# Patient Record
Sex: Female | Born: 1970 | Race: White | Hispanic: No | Smoking: Current some day smoker
Health system: Southern US, Community
[De-identification: ages and names within clinical notes are randomized; demographics above are authoritative.]

## PROBLEM LIST (undated history)

## (undated) DIAGNOSIS — F988 Other specified behavioral and emotional disorders with onset usually occurring in childhood and adolescence: Secondary | ICD-10-CM

## (undated) DIAGNOSIS — K219 Gastro-esophageal reflux disease without esophagitis: Secondary | ICD-10-CM

## (undated) DIAGNOSIS — I10 Essential (primary) hypertension: Secondary | ICD-10-CM

## (undated) HISTORY — PX: CARPAL TUNNEL RELEASE: SHX101

---

## 2006-04-05 ENCOUNTER — Emergency Department: Payer: Self-pay | Admitting: Emergency Medicine

## 2006-05-09 ENCOUNTER — Ambulatory Visit: Payer: Self-pay | Admitting: Internal Medicine

## 2009-01-07 ENCOUNTER — Emergency Department: Payer: Self-pay | Admitting: Emergency Medicine

## 2010-06-15 ENCOUNTER — Emergency Department: Payer: Self-pay | Admitting: Emergency Medicine

## 2010-08-21 ENCOUNTER — Ambulatory Visit: Payer: Self-pay | Admitting: Orthopedic Surgery

## 2011-12-11 IMAGING — CR DG HAND COMPLETE 3+V*L*
1 series · 3 of 3 positions shown · non-contrast
Comparison: none

REASON FOR EXAM: s/p accident
COMMENTS:

[Series 1: view not recorded · 0.17mm/px · 3 of 3 slices shown]
[im 1/3]
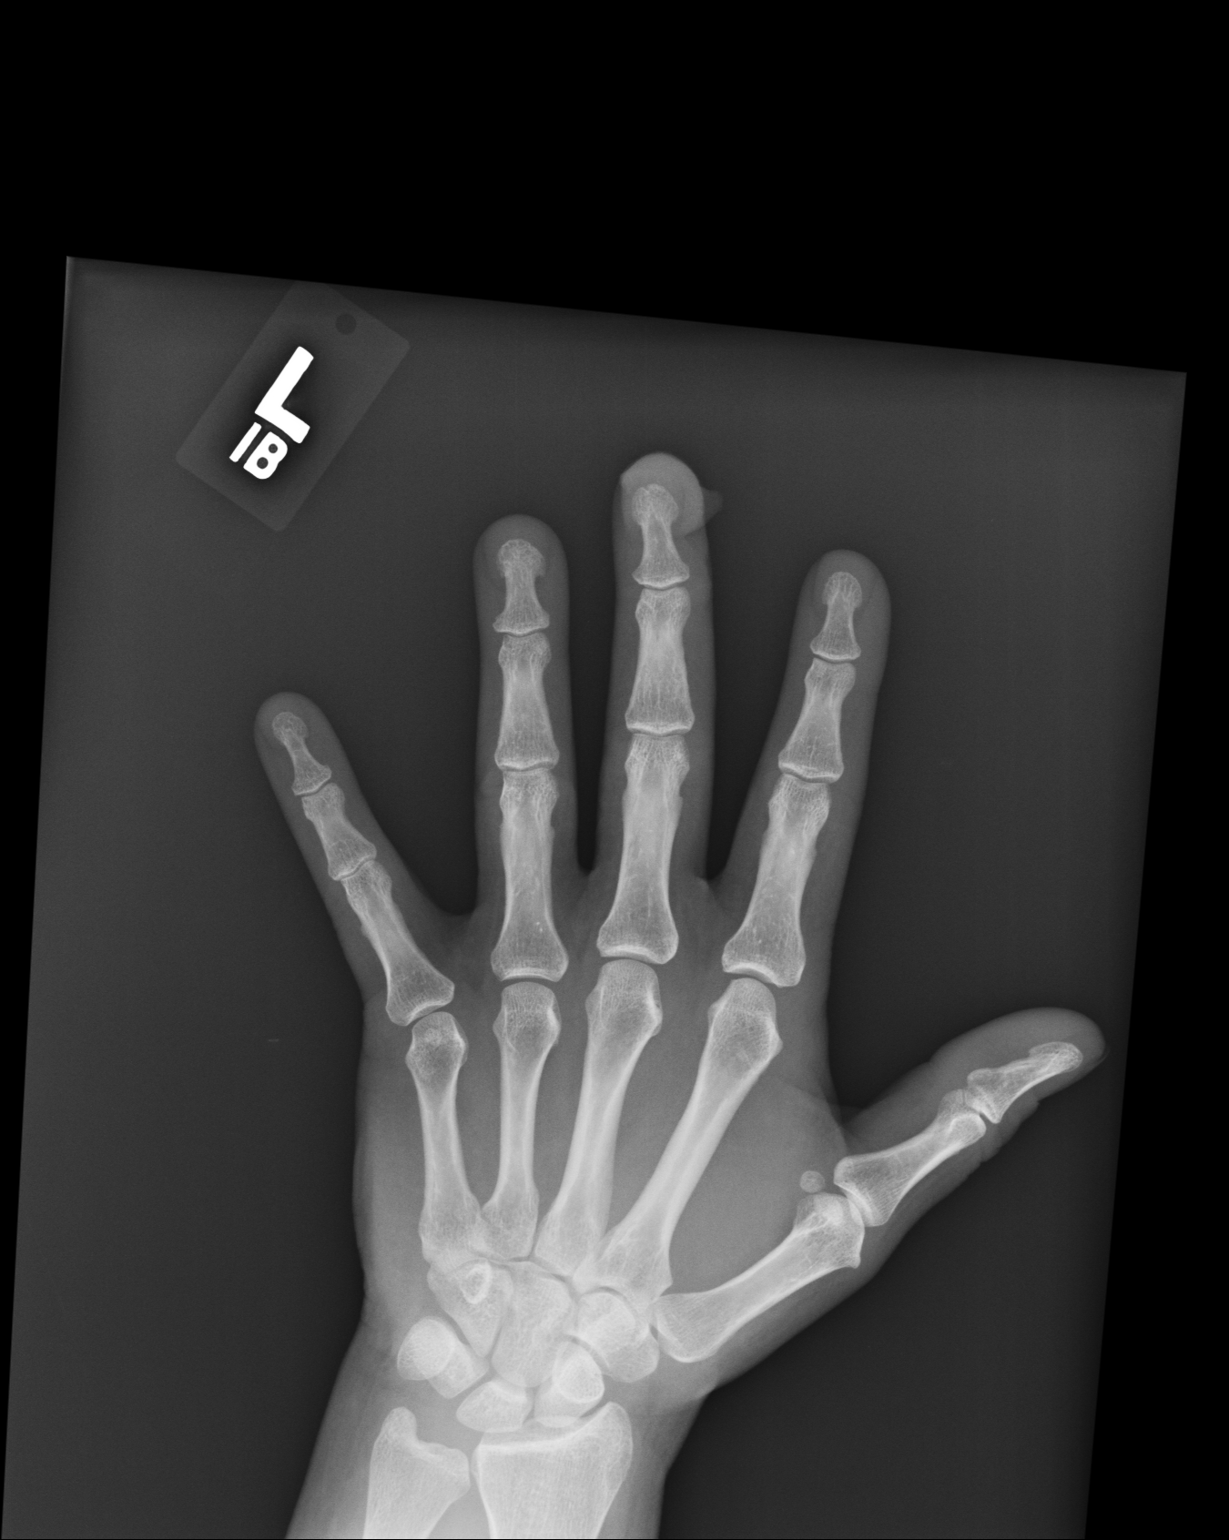
[im 2/3]
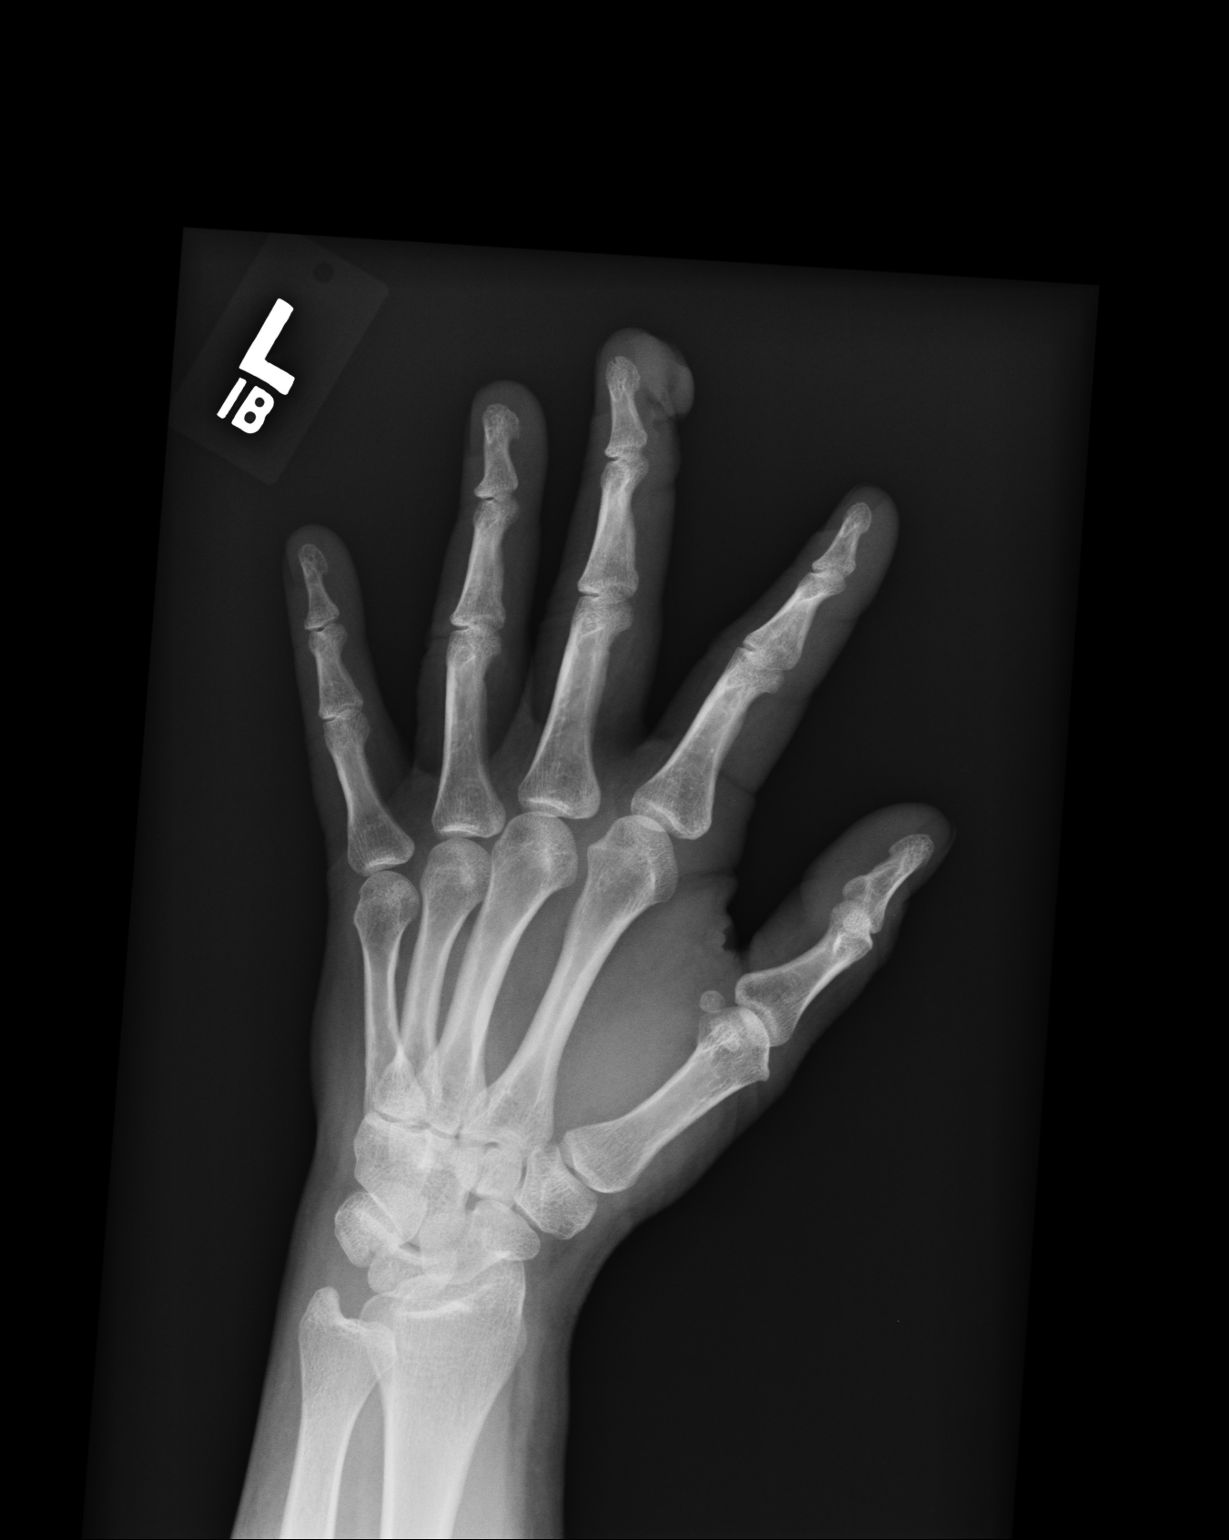
[im 3/3]
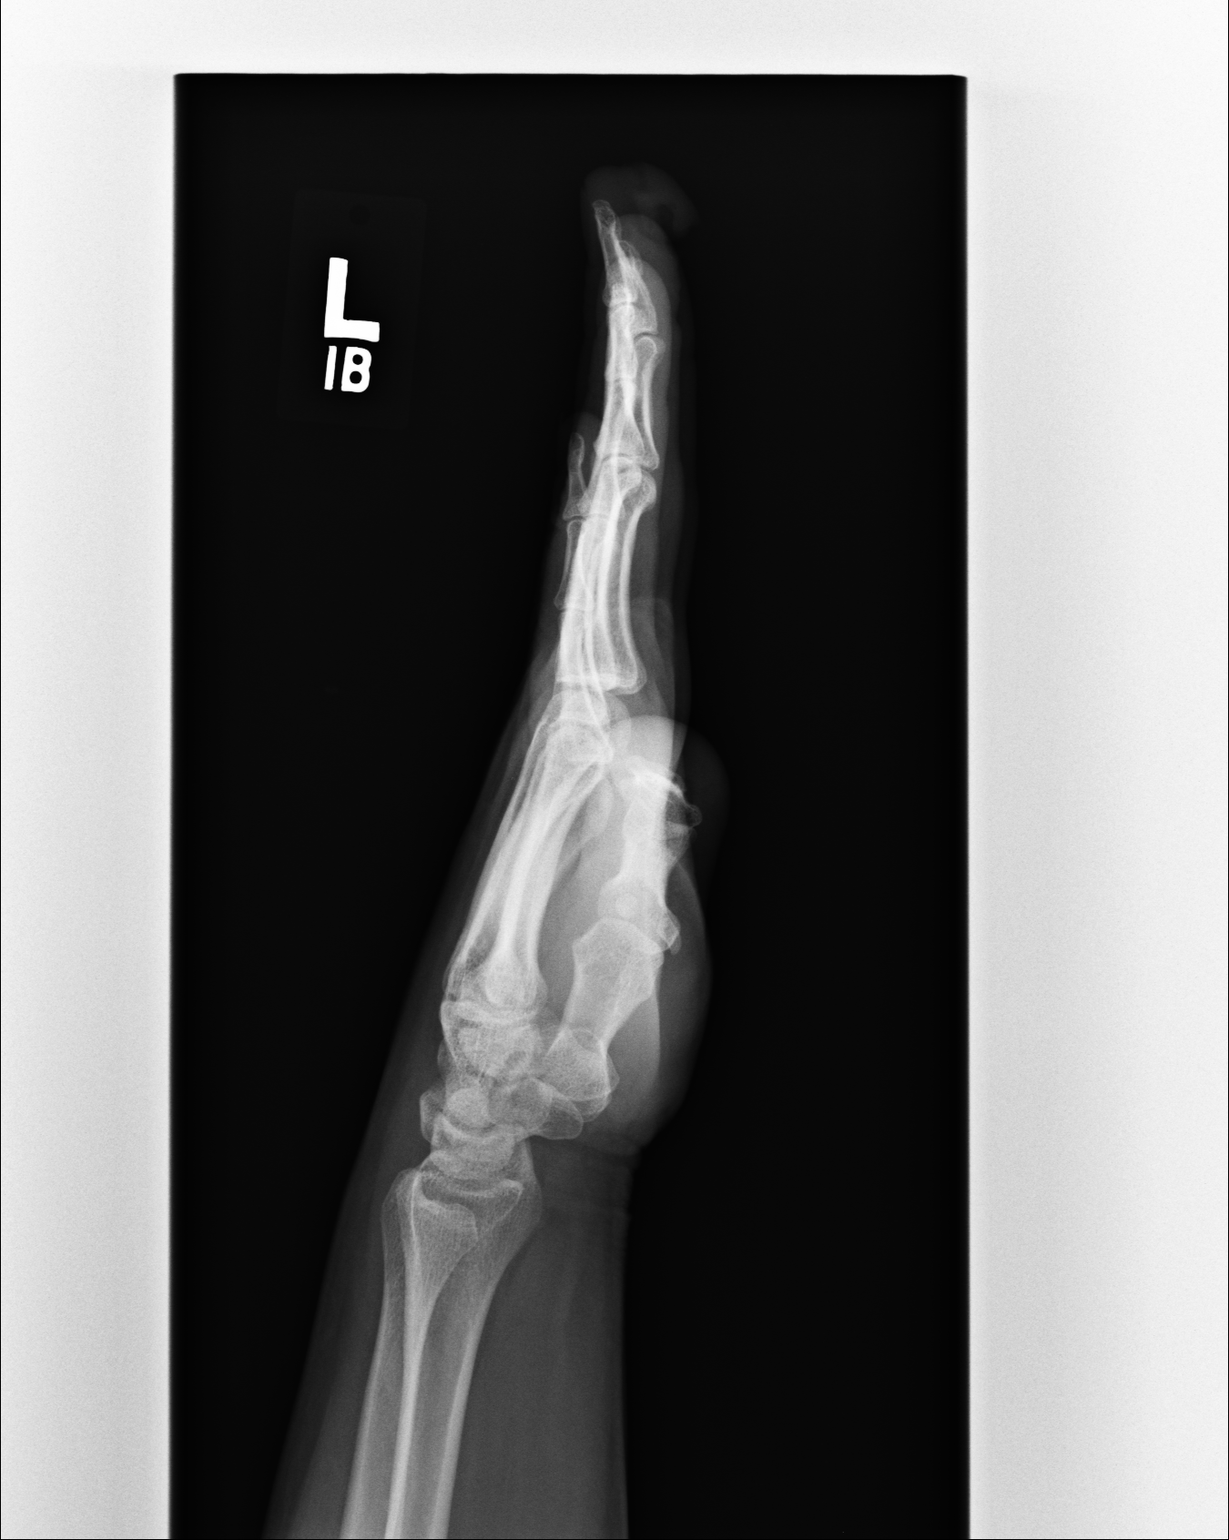

[3 of 3 positions shown; findings below may reference images not displayed]

PROCEDURE:     DXR - DXR HAND LT COMPLETE  W/OBLIQUES  - June 15, 2010  [DATE]

RESULT:     No fracture, dislocation or other acute bony abnormality is
identified. There is observed deformity of the soft tissues and gas within
the soft tissues about the volar aspect of the distal portion of the middle
finger. No radiodense soft tissue foreign body is seen.
IMPRESSION: 1.  No fracture is identified.
2.  There are changes consistent with soft tissue injury volar to the distal
phalanx of the left, middle finger.

## 2012-04-07 ENCOUNTER — Emergency Department (HOSPITAL_COMMUNITY)
Admission: EM | Admit: 2012-04-07 | Discharge: 2012-04-07 | Disposition: A | Discharge status: Home / Self Care | Payer: Medicaid Other | Attending: Emergency Medicine | Admitting: Emergency Medicine

## 2012-04-07 ENCOUNTER — Encounter (HOSPITAL_COMMUNITY): Payer: Self-pay

## 2012-04-07 ENCOUNTER — Emergency Department (HOSPITAL_COMMUNITY): Payer: Medicaid Other

## 2012-04-07 DIAGNOSIS — N134 Hydroureter: Secondary | ICD-10-CM | POA: Insufficient documentation

## 2012-04-07 DIAGNOSIS — F17200 Nicotine dependence, unspecified, uncomplicated: Secondary | ICD-10-CM | POA: Insufficient documentation

## 2012-04-07 DIAGNOSIS — I10 Essential (primary) hypertension: Secondary | ICD-10-CM | POA: Insufficient documentation

## 2012-04-07 DIAGNOSIS — N201 Calculus of ureter: Secondary | ICD-10-CM | POA: Insufficient documentation

## 2012-04-07 DIAGNOSIS — N132 Hydronephrosis with renal and ureteral calculous obstruction: Secondary | ICD-10-CM

## 2012-04-07 HISTORY — DX: Essential (primary) hypertension: I10

## 2012-04-07 LAB — BASIC METABOLIC PANEL
Calcium: 9.6 mg/dL (ref 8.4–10.5)
Creatinine, Ser: 0.85 mg/dL (ref 0.50–1.10)
GFR calc Af Amer: >90 mL/min (ref >90)
GFR calc non Af Amer: 85 mL/min — ABNORMAL LOW (ref >90)
Sodium: 138 mEq/L (ref 135–145)

## 2012-04-07 LAB — CBC
MCH: 28.3 pg (ref 26.0–34.0)
MCV: 82.3 fL (ref 78.0–100.0)
Platelets: 272 10*3/uL (ref 150–400)
RDW: 13.8 % (ref 11.5–15.5)

## 2012-04-07 LAB — URINALYSIS, ROUTINE W REFLEX MICROSCOPIC
Nitrite: NEGATIVE
Protein, ur: 30 mg/dL — AB
Urobilinogen, UA: 0.2 mg/dL (ref 0.0–1.0)

## 2012-04-07 LAB — PREGNANCY, URINE: Preg Test, Ur: NEGATIVE

## 2012-04-07 MED ORDER — KETOROLAC TROMETHAMINE 30 MG/ML IJ SOLN
30.0000 mg | Freq: Once | INTRAMUSCULAR | Status: AC
  Administered 2012-04-07: 30 mg via INTRAVENOUS
  Filled 2012-04-07: qty 1

## 2012-04-07 MED ORDER — HYDROMORPHONE HCL PF 1 MG/ML IJ SOLN
1.0000 mg | Freq: Once | INTRAMUSCULAR | Status: AC
  Administered 2012-04-07: 1 mg via INTRAVENOUS
  Filled 2012-04-07: qty 1

## 2012-04-07 MED ORDER — OXYCODONE-ACETAMINOPHEN 5-325 MG PO TABS: 1.0000 | ORAL_TABLET | Freq: Four times a day (QID) | ORAL | Status: AC | PRN

## 2012-04-07 MED ORDER — ONDANSETRON HCL 4 MG/2ML IJ SOLN
4.0000 mg | Freq: Once | INTRAMUSCULAR | Status: AC
  Administered 2012-04-07: 4 mg via INTRAVENOUS
  Filled 2012-04-07: qty 2

## 2012-04-07 MED ORDER — SODIUM CHLORIDE 0.9 % IV SOLN
Freq: Once | INTRAVENOUS | Status: AC
  Administered 2012-04-07: 07:00:00 via INTRAVENOUS

## 2012-04-07 NOTE — ED Notes (Signed)
Pt up to bathroom, after returning, pt reports worsening pain, rating pain 8/10.  edp notified and orders received.

## 2012-04-07 NOTE — ED Notes (Signed)
Having left lower abdominal pain that started around 4 am this morning per pt. Vomited once this morning after taking one Vicodin. No diarrhea per pt.

## 2012-04-07 NOTE — ED Notes (Signed)
MD at bedside. 

## 2012-04-07 NOTE — ED Provider Notes (Signed)
History  This chart was scribed for [employer]. Rubin Payor, MD by Bennett Scrape. This patient was seen in room [facility] and the patient's care was started at 7:03AM.  CSN: [id number]  Arrival date & time 04/07/12  4098   First MD Initiated Contact with Patient 04/07/12 0703      Chief Complaint  Patient presents with  . Abdominal Pain    The history is provided by the patient. No language interpreter was used.    Tracie Bailey is a 41 y.o. female who presents to the Emergency Department complaining of 3 hours of gradual onset, gradually worsening, constant LLQ abdominal pain that radiates into the suprapubic area with associated nausea, emesis, urgency and frequency. She reports one episode of emesis this morning after takine one Vicodin to improve her symptoms. She denies having any modifying factors. She denies having prior episodes of similar symptoms and denies having a h/o kidney stones. She denies fever, rash, diarrhea, vaginal bleeding and vaginal discharge as associated symptoms and denies that possibility of pregnancy stating that she had a recent Mirena implant change. She has a h/o HTN. She is a current some day smoker and occasional alcohol user.   Past Medical History  Diagnosis Date  . Hypertension     Past Surgical History  Procedure Date  . Carpal tunnel release     No family history on file.  History  Substance Use Topics  . Smoking status: Current Some Day Smoker -- 1.0 packs/day    Types: Cigarettes  . Smokeless tobacco: Not on file  . Alcohol Use: Yes     occasional     NO OB history provided.  Review of Systems  Constitutional: Positive for appetite change. Negative for fever.  Gastrointestinal: Positive for nausea, vomiting and abdominal pain. Negative for diarrhea.  Genitourinary: Positive for urgency and frequency. Negative for dysuria, vaginal bleeding and vaginal discharge.  Musculoskeletal: Negative for back pain.  Skin: Negative for rash.      Allergies  Review of patient's allergies indicates no known allergies.  Home Medications   Current Outpatient Rx  Name Route Sig Dispense Refill  . AMPHETAMINE-DEXTROAMPHET ER 30 MG PO CP24 Oral Take 30 mg by mouth every morning.    Marland Kitchen BENAZEPRIL-HYDROCHLOROTHIAZIDE 20-12.5 MG PO TABS Oral Take 1 tablet by mouth daily.    . IBUPROFEN 200 MG PO TABS Oral Take 400 mg by mouth every 6 (six) hours as needed. For pain    . OMEPRAZOLE 10 MG PO CPDR Oral Take 10 mg by mouth daily.    . OXYCODONE-ACETAMINOPHEN 5-325 MG PO TABS Oral Take 1-2 tablets by mouth every 6 (six) hours as needed for pain. 20 tablet 0    Triage Vitals: BP 148/101  Pulse 78  Temp 98.3 F (36.8 C) (Oral)  Resp 20  Ht 5\' 9"  (1.753 m)  Wt 252 lb (114.306 kg)  BMI 37.21 kg/m2  SpO2 100%  Physical Exam  Nursing note and vitals reviewed. Constitutional: She is oriented to person, place, and time. She appears well-developed and well-nourished. No distress.       Appears uncomfortable, obese  HENT:  Head: Normocephalic and atraumatic.  Eyes: Conjunctivae and EOM are normal.  Neck: Neck supple. No tracheal deviation present.  Cardiovascular: Normal rate, regular rhythm and normal heart sounds.   Pulmonary/Chest: Effort normal and breath sounds normal. No respiratory distress.  Abdominal: Soft. There is tenderness. There is no rebound and no guarding.       Suprapubic to  LLQ tenderness  Musculoskeletal: Normal range of motion.       Moves all extremities spontaneously   Neurological: She is alert and oriented to person, place, and time.  Skin: Skin is warm and dry.  Psychiatric: She has a normal mood and affect. Her behavior is normal.    ED Course  Procedures (including critical care time)  COORDINATION OF CARE: 7:14AM-Discussed treatment plan which includes blood work and urinalysis with pt at bedside and pt agreed to plan. 8:36AM-Pt rechecked and symptoms have been improved with Dilaudid and Zofran.  Informed pt of radiology results and advised pt that she may need to follow up with urology. Discussed plan to treat pain with Toradol and recheck later.  9:25AM-Pt rechecked and feels improved with the Toradol. Discussed discharge instructions and pt agreed to plan.  Results for orders placed during the hospital encounter of 04/07/12  URINALYSIS, ROUTINE W REFLEX MICROSCOPIC      Component Value Range   Color, Urine YELLOW  YELLOW   APPearance HAZY (*) CLEAR   Specific Gravity, Urine 1.030  1.005 - 1.030   pH 5.5  5.0 - 8.0   Glucose, UA NEGATIVE  NEGATIVE mg/dL   Hgb urine dipstick LARGE (*) NEGATIVE   Bilirubin Urine SMALL (*) NEGATIVE   Ketones, ur NEGATIVE  NEGATIVE mg/dL   Protein, ur 30 (*) NEGATIVE mg/dL   Urobilinogen, UA 0.2  0.0 - 1.0 mg/dL   Nitrite NEGATIVE  NEGATIVE   Leukocytes, UA NEGATIVE  NEGATIVE  CBC      Component Value Range   WBC 14.3 (*) 4.0 - 10.5 K/uL   RBC 5.20 (*) 3.87 - 5.11 MIL/uL   Hemoglobin 14.7  12.0 - 15.0 g/dL   HCT 04.5  40.9 - 81.1 %   MCV 82.3  78.0 - 100.0 fL   MCH 28.3  26.0 - 34.0 pg   MCHC 34.3  30.0 - 36.0 g/dL   RDW 91.4  78.2 - 95.6 %   Platelets 272  150 - 400 K/uL  BASIC METABOLIC PANEL      Component Value Range   Sodium 138  135 - 145 mEq/L   Potassium 3.3 (*) 3.5 - 5.1 mEq/L   Chloride 101  96 - 112 mEq/L   CO2 26  19 - 32 mEq/L   Glucose, Bld 119 (*) 70 - 99 mg/dL   BUN 8  6 - 23 mg/dL   Creatinine, Ser 2.13  0.50 - 1.10 mg/dL   Calcium 9.6  8.4 - 08.6 mg/dL   GFR calc non Af Amer 85 (*) >90 mL/min   GFR calc Af Amer >90  >90 mL/min  PREGNANCY, URINE      Component Value Range   Preg Test, Ur NEGATIVE  NEGATIVE  URINE MICROSCOPIC-ADD ON      Component Value Range   Squamous Epithelial / LPF RARE  RARE   RBC / HPF TOO NUMEROUS TO COUNT  <3 RBC/hpf   Bacteria, UA RARE  RARE    Ct Abdomen Pelvis Wo Contrast  04/07/2012  *RADIOLOGY REPORT*  Clinical Data: Left flank pain  CT ABDOMEN AND PELVIS WITHOUT CONTRAST   Technique:  Multidetector CT imaging of the abdomen and pelvis was performed following the standard protocol without intravenous contrast.  Comparison: None.  Findings: Minimal patchy opacity in the left lower lobe, possibly subsegmental atelectasis.  Unenhanced liver, spleen, pancreas, and left adrenal gland are within normal limits.  10 mm right adrenal adenoma (series 2/image 31).  Gallbladder is unremarkable.  No intrahepatic or extrahepatic ductal dilatation.  Mild left hydronephrosis.  The kidneys are otherwise unremarkable.  No evidence of bowel obstruction.  Normal appendix.  No evidence of abdominal aortic aneurysm.  No abdominopelvic ascites.  No suspicious abdominopelvic lymphadenopathy.  Uterus is notable for an IUD in satisfactory position.  3.4 cm left ovarian cyst, likely physiologic.  Right ovary is unremarkable  Moderate left hydroureter.  Associated 7 mm distal left ureteral calculus at the UVJ (coronal image 50).  Bladder is decompressed.  Mild degenerative changes of the visualized thoracolumbar spine.  IMPRESSION: 7 mm obstructing distal left ureteral calculus at the UVJ. Associated mild to moderate left hydroureteronephrosis.  10 mm right adrenal adenoma.  IUD in satisfactory position.  Original Report Authenticated By: Charline Bills, M.D.     1. Ureteral stone with hydronephrosis       MDM  Patient with left-sided abdominal pain. 7 millimeter ureteral stone on CT. Mild to moderate hydronephrosis. Also has an adrenal adenoma that will need to be followed. Patient's pain is improved with treatment. She's not appear to have a UTI. She'll follow with urology.      I personally performed the services described in this documentation, which was scribed in my presence. The recorded information has been reviewed and considered.     Juliet Rude. Rubin Payor, MD 04/07/12 [phone]

## 2012-04-29 ENCOUNTER — Encounter (HOSPITAL_COMMUNITY): Payer: Self-pay | Admitting: Pharmacy Technician

## 2012-04-29 NOTE — Patient Instructions (Addendum)
Your procedure is scheduled on:    Report to [facility] at      AM.  Call this number if you have problems the morning of surgery: [phone]   Remember:   Do not drink or eat food:After Midnight.    Clear liquids include soda, tea, black coffee, apple or grape juice, broth.  Take these medicines the morning of surgery with A SIP OF WATER:    Do not wear jewelry, make-up or nail polish.  Do not wear lotions, powders, or perfumes. You may wear deodorant.  Do not shave 48 hours prior to surgery.  Do not bring valuables to the hospital.  Contacts, dentures or bridgework may not be worn into surgery.  Leave suitcase in the car. After surgery it may be brought to your room.  For patients admitted to the hospital, checkout time is 11:00 AM the day of discharge.   Patients discharged the day of surgery will not be allowed to drive home.  Name and phone number of your driver:   Special Instructions: CHG Shower use special wash: 1/2 bottle night before surgery and 1/2 bottle morning of surgery.   Please read over the following fact sheets that you were given: Pain Booklet, MRSA  Surgical Site Infection Prevention, Anesthesia Post-op Instructions and Care and Recovery After Surgery    Cystoscopy (Bladder Exam) Care After Refer to this sheet in the next few weeks. These discharge instructions provide you with general information on caring for yourself after you leave the hospital. Your caregiver may also give you specific instructions. Your treatment has been planned according to the most current medical practices available, but unavoidable complications sometimes occur. If you have any problems or questions after discharge, please call your caregiver. AFTER THE PROCEDURE   There may be temporary bleeding and burning with urination.   Drink enough water and fluids to keep your urine clear or pale yellow.  FINDING OUT THE RESULTS OF YOUR TEST Not all test results are available during your visit.  If your test results are not back during the visit, make an appointment with your caregiver to find out the results. Do not assume everything is normal if you have not heard from your caregiver or the medical facility. It is important for you to follow up on all of your test results. SEEK IMMEDIATE MEDICAL CARE IF:   There is an increase in blood in the urine or you are passing clots.   There is difficulty passing urine.   You develop the chills.   You have an oral temperature above 102 F (38.9 C), not controlled by medicine.   Belly (abdominal) pain develops.  Document Released: 03/16/2005 Document Revised: 08/16/2011 Document Reviewed: 01/12/2008 [facility] Patient Information 2012 [place], [place].

## 2012-04-30 ENCOUNTER — Other Ambulatory Visit: Payer: Self-pay

## 2012-04-30 ENCOUNTER — Encounter (HOSPITAL_COMMUNITY): Payer: Self-pay

## 2012-04-30 ENCOUNTER — Encounter (HOSPITAL_COMMUNITY)
Admission: RE | Admit: 2012-04-30 | Discharge: 2012-04-30 | Disposition: A | Discharge status: Home / Self Care | Payer: Medicaid Other | Source: Ambulatory Visit | Attending: Urology | Admitting: Urology

## 2012-04-30 HISTORY — DX: Other specified behavioral and emotional disorders with onset usually occurring in childhood and adolescence: F98.8

## 2012-04-30 HISTORY — DX: Gastro-esophageal reflux disease without esophagitis: K21.9

## 2012-04-30 LAB — HCG, SERUM, QUALITATIVE: Preg, Serum: NEGATIVE

## 2012-04-30 LAB — BASIC METABOLIC PANEL
BUN: 13 mg/dL (ref 6–23)
CO2: 27 mEq/L (ref 19–32)
Chloride: 102 mEq/L (ref 96–112)
GFR calc Af Amer: >90 mL/min (ref >90)
Glucose, Bld: 98 mg/dL (ref 70–99)
Potassium: 4 mEq/L (ref 3.5–5.1)

## 2012-04-30 NOTE — Progress Notes (Signed)
04/30/12 1354  OBSTRUCTIVE SLEEP APNEA  Have you ever been diagnosed with sleep apnea through a sleep study? No  Do you snore loudly (loud enough to be heard through closed doors)?  1  Do you often feel tired, fatigued, or sleepy during the daytime? 1  Has anyone observed you stop breathing during your sleep? 0  Do you have, or are you being treated for high blood pressure? 1  BMI more than 35 kg/m2? 1  Neck circumference greater than 40 cm/18 inches? 0  Gender: 0  Obstructive Sleep Apnea Score 4   Score 4 or greater  Updated health history;Results sent to PCP

## 2012-05-06 ENCOUNTER — Encounter (HOSPITAL_COMMUNITY): Payer: Self-pay | Admitting: Anesthesiology

## 2012-05-06 ENCOUNTER — Ambulatory Visit (HOSPITAL_COMMUNITY): Payer: Medicaid Other

## 2012-05-06 ENCOUNTER — Other Ambulatory Visit (HOSPITAL_COMMUNITY): Payer: Self-pay | Admitting: Urology

## 2012-05-06 ENCOUNTER — Ambulatory Visit (HOSPITAL_COMMUNITY)
Admission: RE | Admit: 2012-05-06 | Discharge: 2012-05-06 | Disposition: A | Discharge status: Home / Self Care | Payer: Medicaid Other | Source: Ambulatory Visit | Attending: Urology | Admitting: Urology

## 2012-05-06 ENCOUNTER — Ambulatory Visit (HOSPITAL_COMMUNITY): Payer: Medicaid Other | Admitting: Anesthesiology

## 2012-05-06 ENCOUNTER — Encounter (HOSPITAL_COMMUNITY)
Admission: RE | Disposition: A | Discharge status: Home / Self Care | Payer: Self-pay | Source: Ambulatory Visit | Attending: Urology

## 2012-05-06 ENCOUNTER — Encounter (HOSPITAL_COMMUNITY): Payer: Self-pay | Admitting: *Deleted

## 2012-05-06 DIAGNOSIS — N201 Calculus of ureter: Secondary | ICD-10-CM

## 2012-05-06 DIAGNOSIS — Z01812 Encounter for preprocedural laboratory examination: Secondary | ICD-10-CM | POA: Insufficient documentation

## 2012-05-06 DIAGNOSIS — Z01810 Encounter for preprocedural cardiovascular examination: Secondary | ICD-10-CM | POA: Insufficient documentation

## 2012-05-06 DIAGNOSIS — I10 Essential (primary) hypertension: Secondary | ICD-10-CM | POA: Insufficient documentation

## 2012-05-06 HISTORY — PX: CYSTOSCOPY/RETROGRADE/URETEROSCOPY: SHX5316

## 2012-05-06 SURGERY — CYSTOSCOPY/RETROGRADE/URETEROSCOPY
Anesthesia: General | Laterality: Left | Wound class: Clean Contaminated

## 2012-05-06 MED ORDER — OXYCODONE-ACETAMINOPHEN 7.5-325 MG PO TABS: 1.0000 | ORAL_TABLET | Freq: Four times a day (QID) | ORAL | Status: AC | PRN

## 2012-05-06 MED ORDER — STERILE WATER FOR IRRIGATION IR SOLN
Status: DC | PRN
  Administered 2012-05-06: 1000 mL

## 2012-05-06 MED ORDER — FENTANYL CITRATE 0.05 MG/ML IJ SOLN
INTRAMUSCULAR | Status: AC
  Filled 2012-05-06: qty 2

## 2012-05-06 MED ORDER — ONDANSETRON HCL 4 MG/2ML IJ SOLN: 4.0000 mg | Freq: Once | INTRAMUSCULAR | Status: DC | PRN

## 2012-05-06 MED ORDER — LACTATED RINGERS IV SOLN
INTRAVENOUS | Status: DC
  Administered 2012-05-06: 08:00:00 via INTRAVENOUS

## 2012-05-06 MED ORDER — FENTANYL CITRATE 0.05 MG/ML IJ SOLN
INTRAMUSCULAR | Status: DC | PRN
  Administered 2012-05-06: 50 ug via INTRAVENOUS

## 2012-05-06 MED ORDER — ONDANSETRON HCL 4 MG/2ML IJ SOLN
4.0000 mg | Freq: Once | INTRAMUSCULAR | Status: AC
  Administered 2012-05-06: 4 mg via INTRAVENOUS

## 2012-05-06 MED ORDER — ROCURONIUM BROMIDE 100 MG/10ML IV SOLN
INTRAVENOUS | Status: DC | PRN
  Administered 2012-05-06: 5 mg via INTRAVENOUS
  Administered 2012-05-06: 35 mg via INTRAVENOUS

## 2012-05-06 MED ORDER — NEOSTIGMINE METHYLSULFATE 1 MG/ML IJ SOLN
INTRAMUSCULAR | Status: DC | PRN
  Administered 2012-05-06: 2 mg via INTRAVENOUS

## 2012-05-06 MED ORDER — NEOSTIGMINE METHYLSULFATE 1 MG/ML IJ SOLN
INTRAMUSCULAR | Status: AC
  Filled 2012-05-06: qty 10

## 2012-05-06 MED ORDER — IOHEXOL 350 MG/ML SOLN
INTRAVENOUS | Status: DC | PRN
  Administered 2012-05-06: 8 mL

## 2012-05-06 MED ORDER — GLYCOPYRROLATE 0.2 MG/ML IJ SOLN
INTRAMUSCULAR | Status: DC | PRN
  Administered 2012-05-06: 0.4 mg via INTRAVENOUS

## 2012-05-06 MED ORDER — LIDOCAINE HCL (CARDIAC) 10 MG/ML IV SOLN
INTRAVENOUS | Status: DC | PRN
  Administered 2012-05-06: 20 mg via INTRAVENOUS

## 2012-05-06 MED ORDER — ROCURONIUM BROMIDE 50 MG/5ML IV SOLN
INTRAVENOUS | Status: AC
  Filled 2012-05-06: qty 1

## 2012-05-06 MED ORDER — FENTANYL CITRATE 0.05 MG/ML IJ SOLN
25.0000 ug | INTRAMUSCULAR | Status: DC | PRN
  Administered 2012-05-06: 50 ug via INTRAVENOUS

## 2012-05-06 MED ORDER — PROPOFOL 10 MG/ML IV BOLUS
INTRAVENOUS | Status: DC | PRN
  Administered 2012-05-06: 150 mg via INTRAVENOUS
  Administered 2012-05-06: 20 mg via INTRAVENOUS

## 2012-05-06 MED ORDER — PROPOFOL 10 MG/ML IV EMUL: INTRAVENOUS | Status: DC | PRN

## 2012-05-06 MED ORDER — GLYCOPYRROLATE 0.2 MG/ML IJ SOLN
INTRAMUSCULAR | Status: AC
  Filled 2012-05-06: qty 2

## 2012-05-06 MED ORDER — PROPOFOL 10 MG/ML IV EMUL
INTRAVENOUS | Status: AC
  Filled 2012-05-06: qty 20

## 2012-05-06 MED ORDER — SODIUM CHLORIDE 0.9 % IR SOLN
Status: DC | PRN
  Administered 2012-05-06 (×2): 3000 mL

## 2012-05-06 MED ORDER — LIDOCAINE HCL (PF) 1 % IJ SOLN
INTRAMUSCULAR | Status: AC
  Filled 2012-05-06: qty 5

## 2012-05-06 MED ORDER — MIDAZOLAM HCL 2 MG/2ML IJ SOLN
INTRAMUSCULAR | Status: AC
  Filled 2012-05-06: qty 2

## 2012-05-06 MED ORDER — ONDANSETRON HCL 4 MG/2ML IJ SOLN
INTRAMUSCULAR | Status: AC
  Filled 2012-05-06: qty 2

## 2012-05-06 MED ORDER — MIDAZOLAM HCL 2 MG/2ML IJ SOLN
1.0000 mg | INTRAMUSCULAR | Status: DC | PRN
  Administered 2012-05-06: 2 mg via INTRAVENOUS

## 2012-05-06 SURGICAL SUPPLY — 22 items
BAG DRAIN URO TABLE W/ADPT NS (DRAPE) ×2 IMPLANT
CATH 5 FR WEDGE TIP (UROLOGICAL SUPPLIES) ×2 IMPLANT
CATH OPEN TIP 5FR (CATHETERS) ×2 IMPLANT
CLOTH BEACON ORANGE TIMEOUT ST (SAFETY) ×2 IMPLANT
DILATOR UROMAX ULTRA (MISCELLANEOUS) ×2 IMPLANT
GLOVE BIO SURGEON STRL SZ7 (GLOVE) ×2 IMPLANT
GLOVE BIOGEL PI IND STRL 7.5 (GLOVE) ×1 IMPLANT
GLOVE BIOGEL PI INDICATOR 7.5 (GLOVE) ×1
GLOVE ECLIPSE 7.0 STRL STRAW (GLOVE) ×2 IMPLANT
GLOVE EXAM NITRILE MD LF STRL (GLOVE) ×2 IMPLANT
GOWN STRL REIN XL XLG (GOWN DISPOSABLE) ×2 IMPLANT
IV NS IRRIG 3000ML ARTHROMATIC (IV SOLUTION) ×4 IMPLANT
KIT ROOM TURNOVER AP CYSTO (KITS) ×2 IMPLANT
LASER FIBER DISP (UROLOGICAL SUPPLIES) IMPLANT
LASER FIBER DISP 1000U (UROLOGICAL SUPPLIES) IMPLANT
MANIFOLD NEPTUNE II (INSTRUMENTS) ×2 IMPLANT
PACK CYSTO (CUSTOM PROCEDURE TRAY) ×2 IMPLANT
PAD ARMBOARD 7.5X6 YLW CONV (MISCELLANEOUS) ×2 IMPLANT
STENT PERCUFLEX 4.8FRX24 (STENTS) IMPLANT
STONE RETRIEVAL GEMINI 2.4 FR (MISCELLANEOUS) IMPLANT
TOWEL OR 17X26 4PK STRL BLUE (TOWEL DISPOSABLE) ×2 IMPLANT
WIRE GUIDE BENTSON .035 15CM (WIRE) ×2 IMPLANT

## 2012-05-06 NOTE — H&P (Signed)
NAMEMAEVEN, MCDOUGALL                ACCOUNT NO.:  [id number]  MEDICAL RECORD NO.:  [id number]  LOCATION:                                 FACILITY:  PHYSICIAN:  Ky Barban, M.D.DATE OF BIRTH:  15-Apr-1971  DATE OF ADMISSION:  05/06/2012 DATE OF DISCHARGE:  LH                             HISTORY & PHYSICAL   CHIEF COMPLAINT:  Recurrent left renal colic.  HISTORY:  A 41 year old female went to the emergency room [facility] on July 29, with left renal colic.  CT scan shows that she has a 7-mm stone in the left ureterovesical junction causing partial obstruction.  She is still having pain, and I have advised her to undergo stone basket.  I discussed that she wanted me to go because she said she has been suffering with this pain for so long, and she wanted something done about it.  So, I advised her to undergo stone basket.  She is coming as outpatient in the morning.  We will do a stone basket and leave a double- J stent as outpatient.  Procedure limitation and complications have been discussed in detail with the patient.  I told her, 1. Stone migration then I may not be able to find the stone.  No     guarantee about getting the stone out. 2. Ureteral perforation leading to open surgery.  She understands and     want me to go ahead and schedule it.  PAST MEDICAL HISTORY:  No significant medical problem except hypertension.  No diabetes.  SURGICAL HISTORY:  Include that she had left carpal tunnel surgery, December 2011.  PERSONAL HISTORY:  Smokes 1 pack a day.  Drinks once every month.  REVIEW OF SYSTEMS:  Unremarkable.  PHYSICAL EXAMINATION:  GENERAL:  Moderately built female, not in acute distress, fully conscious, alert, oriented. VITAL SIGNS:  Blood pressure is 130/80, temperature is normal. CENTRAL NERVOUS SYSTEM:  No gross neurological deficit. HEAD, NECK, EYES, ENT:  Negative. CHEST:  Symmetrical. HEART:  Regular sinus rhythm.  No murmur. ABDOMEN:  Soft, flat.   Liver, spleen, kidneys are not palpable.  No CVA tenderness. PELVIC:  No adnexal mass or tenderness.  IMPRESSION:  Left distal ureteral calculus.  PLAN:  Cystoscopy left retrograde pyelogram, ureteroscopic stone basket, holmium laser lithotripsy insertion of double-J stent under anesthesia as outpatient.     Ky Barban, M.D.     MIJ/MEDQ  D:  05/05/2012  T:  05/06/2012  Job:  [id number]

## 2012-05-06 NOTE — Progress Notes (Signed)
No change in H&P on reexamination. 

## 2012-05-06 NOTE — Progress Notes (Signed)
Awake. No drainage from meatus.

## 2012-05-06 NOTE — Addendum Note (Signed)
Addendum  created 05/06/12 1025 by Franco Nones, CRNA   Modules edited:Charges VN

## 2012-05-06 NOTE — Progress Notes (Signed)
Awake. Wants to get up. Needs to void. Refuses bedpan. Rates pain 3. Drinking coke. Tolerated well.

## 2012-05-06 NOTE — Anesthesia Procedure Notes (Signed)
Procedure Name: Intubation Date/Time: 05/06/2012 9:30 AM Performed by: Franco Nones Pre-anesthesia Checklist: Patient identified, Patient being monitored, Timeout performed, Emergency Drugs available and Suction available Patient Re-evaluated:Patient Re-evaluated prior to inductionOxygen Delivery Method: Circle System Utilized Preoxygenation: Pre-oxygenation with 100% oxygen Intubation Type: IV induction, Cricoid Pressure applied and Rapid sequence Ventilation: Mask ventilation without difficulty Laryngoscope Size: Miller and 2 Grade View: Grade I Tube type: Oral Tube size: 7.0 mm Number of attempts: 1 Airway Equipment and Method: stylet Placement Confirmation: ETT inserted through vocal cords under direct vision,  positive ETCO2 and breath sounds checked- equal and bilateral Secured at: 21 cm Tube secured with: Tape Dental Injury: Teeth and Oropharynx as per pre-operative assessment

## 2012-05-06 NOTE — Brief Op Note (Signed)
05/06/2012  10:00 AM  PATIENT:  Tracie Bailey  41 y.o. female  PRE-OPERATIVE DIAGNOSIS:  left ureteral calculus  POST-OPERATIVE DIAGNOSIS:  * No post-op diagnosis entered *  PROCEDURE:  Procedure(s) (LRB): CYSTOSCOPY/RETROGRADE/URETEROSCOPY (Left)  SURGEON:  Surgeon(s) and Role:    * Ky Barban, MD - Primary  PHYSICIAN ASSISTANT:   ASSISTANTS: none   ANESTHESIA:   general  EBL:  Total I/O In: 600 [I.V.:600] Out: -   BLOOD ADMINISTERED:none  DRAINS: none   LOCAL MEDICATIONS USED:  NONE  SPECIMEN:  No Specimen  DISPOSITION OF SPECIMEN:  N/A  COUNTS:  YES  TOURNIQUET:  * No tourniquets in log *  DICTATION: .Other Dictation: Dictation Number dictation#[id number]  PLAN OF CARE: Discharge to home after PACU  PATIENT DISPOSITION:  PACU - hemodynamically stable.   Delay start of Pharmacological VTE agent (>24hrs) due to surgical blood loss or risk of bleeding:

## 2012-05-06 NOTE — Anesthesia Preprocedure Evaluation (Signed)
Anesthesia Evaluation  Patient identified by MRN, date of birth, ID band Patient awake    Reviewed: Allergy & Precautions, H&P , NPO status , Patient's Chart, lab work & pertinent test results  History of Anesthesia Complications Negative for: history of anesthetic complications  Airway Mallampati: II TM Distance: >3 FB     Dental  (+) Edentulous Upper and Edentulous Lower   Pulmonary Current Smoker,    Pulmonary exam normal       Cardiovascular hypertension, Pt. on medications Rhythm:Regular     Neuro/Psych PSYCHIATRIC DISORDERS (ADD)    GI/Hepatic GERD-  Medicated and Controlled,  Endo/Other    Renal/GU      Musculoskeletal   Abdominal   Peds  Hematology   Anesthesia Other Findings   Reproductive/Obstetrics                           Anesthesia Physical Anesthesia Plan  ASA: II  Anesthesia Plan: General   Post-op Pain Management:    Induction: Intravenous, Rapid sequence and Cricoid pressure planned  Airway Management Planned: Oral ETT  Additional Equipment:   Intra-op Plan:   Post-operative Plan: Extubation in OR  Informed Consent: I have reviewed the patients History and Physical, chart, labs and discussed the procedure including the risks, benefits and alternatives for the proposed anesthesia with the patient or authorized representative who has indicated his/her understanding and acceptance.     Plan Discussed with:   Anesthesia Plan Comments:         Anesthesia Quick Evaluation

## 2012-05-06 NOTE — Addendum Note (Signed)
Addendum  created 05/06/12 1110 by Franco Nones, CRNA   Modules edited:Anesthesia Flowsheet

## 2012-05-06 NOTE — Anesthesia Postprocedure Evaluation (Signed)
Anesthesia Post Note  Patient: Tracie Bailey  Procedure(s) Performed: Procedure(s) (LRB): CYSTOSCOPY/RETROGRADE/URETEROSCOPY (Left)  Anesthesia type: General  Patient location: PACU  Post pain: Pain level controlled  Post assessment: Post-op Vital signs reviewed, Patient's Cardiovascular Status Stable, Respiratory Function Stable, Patent Airway, No signs of Nausea or vomiting and Pain level controlled  Last Vitals:  Filed Vitals:   05/06/12 1015  BP: 123/81  Pulse:   Temp: 37 C  Resp: 14    Post vital signs: Reviewed and stable  Level of consciousness: awake and alert   Complications: No apparent anesthesia complications

## 2012-05-06 NOTE — Transfer of Care (Signed)
Immediate Anesthesia Transfer of Care Note  Patient: Tracie Bailey  Procedure(s) Performed: Procedure(s) (LRB): CYSTOSCOPY/RETROGRADE/URETEROSCOPY (Left)  Patient Location: PACU  Anesthesia Type: General  Level of Consciousness: awake  Airway & Oxygen Therapy: Patient Spontanous Breathing and non-rebreather face mask  Post-op Assessment: Report given to PACU RN, Post -op Vital signs reviewed and stable and Patient moving all extremities  Post vital signs: Reviewed and stable  Complications: No apparent anesthesia complications

## 2012-05-07 NOTE — Op Note (Signed)
NAMEMAEBRY, OBRIEN                ACCOUNT NO.:  [id number]  MEDICAL RECORD NO.:  [id number]  LOCATION:  [facility]                          FACILITY:  [facility]  PHYSICIAN:  Ky Barban, M.D.DATE OF BIRTH:  20-Oct-1970  DATE OF PROCEDURE: DATE OF DISCHARGE:  05/06/2012                              OPERATIVE REPORT   PREOPERATIVE DIAGNOSIS:  Left distal ureteral calculus.  POSTOPERATIVE DIAGNOSIS:  No ureteral calculus.  PROCEDURE:  Cystoscopy, left retrograde pyelogram, and left ureteroscopy.  ANESTHESIA:  General.  PROCEDURE IN DETAIL:  The patient under general endotracheal anesthesia in lithotomy position.  After usual prep and drape, #25 cystoscope introduced into the bladder.  It was inspected.  There was no foreign body or stone in the bladder.  Left ureteral orifice looks normal.  It was catheterized with a wedge catheter.  Hypaque was injected under fluoroscopic control.  The dye goes up into the renal pelvis.  The ureter was slightly dilated, but I do not see any definite filling defect.  There was bubble a filling defect in the mid ureter, but nothing in the ureterovesical junction, but I decided to look into the ureterovesical junction to make sure there was no stone.  A guidewire was passed up in the left renal pelvis over the guidewire.  Introduced #15 balloon dilator in the intramural ureter, which was dilated.  Once the intramural ureter was dilated, balloon was removed leaving the guidewire in place.  Short rigid ureteroscope was introduced alongside of the guidewire, I went up into the upper ureter.  There was no stone or foreign body in the ureter and looked again on the way out, so there was no stone.  She probably passed it, so I decided to terminate the procedure.  All the instruments and guidewire was removed.  I decided not to put any double-J stent.  The patient left the operating room in satisfactory condition.     Ky Barban,  M.D.     MIJ/MEDQ  D:  05/06/2012  T:  05/07/2012  Job:  [id number]

## 2012-05-09 ENCOUNTER — Encounter (HOSPITAL_COMMUNITY): Payer: Self-pay | Admitting: Urology

## 2013-03-09 ENCOUNTER — Other Ambulatory Visit (HOSPITAL_COMMUNITY): Payer: Self-pay | Admitting: Family Medicine

## 2013-03-09 DIAGNOSIS — Z139 Encounter for screening, unspecified: Secondary | ICD-10-CM

## 2013-03-12 ENCOUNTER — Ambulatory Visit (HOSPITAL_COMMUNITY): Payer: Medicaid Other

## 2013-03-30 ENCOUNTER — Ambulatory Visit (HOSPITAL_COMMUNITY)
Admission: RE | Admit: 2013-03-30 | Discharge: 2013-03-30 | Disposition: A | Discharge status: Home / Self Care | Payer: Medicaid Other | Source: Ambulatory Visit | Attending: Family Medicine | Admitting: Family Medicine

## 2013-03-30 DIAGNOSIS — Z139 Encounter for screening, unspecified: Secondary | ICD-10-CM

## 2013-03-30 DIAGNOSIS — Z1231 Encounter for screening mammogram for malignant neoplasm of breast: Secondary | ICD-10-CM | POA: Insufficient documentation

## 2013-04-02 ENCOUNTER — Other Ambulatory Visit: Payer: Self-pay | Admitting: Family Medicine

## 2013-04-02 DIAGNOSIS — R928 Other abnormal and inconclusive findings on diagnostic imaging of breast: Secondary | ICD-10-CM

## 2013-04-15 ENCOUNTER — Ambulatory Visit (HOSPITAL_COMMUNITY)
Admission: RE | Admit: 2013-04-15 | Discharge: 2013-04-15 | Disposition: A | Discharge status: Home / Self Care | Payer: Medicaid Other | Source: Ambulatory Visit | Attending: Family Medicine | Admitting: Family Medicine

## 2013-04-15 DIAGNOSIS — R928 Other abnormal and inconclusive findings on diagnostic imaging of breast: Secondary | ICD-10-CM | POA: Insufficient documentation

## 2013-04-15 DIAGNOSIS — N6489 Other specified disorders of breast: Secondary | ICD-10-CM | POA: Insufficient documentation

## 2013-05-19 ENCOUNTER — Other Ambulatory Visit (HOSPITAL_COMMUNITY): Payer: Self-pay | Admitting: Orthopaedic Surgery

## 2013-05-19 DIAGNOSIS — M25511 Pain in right shoulder: Secondary | ICD-10-CM

## 2013-05-21 ENCOUNTER — Ambulatory Visit (HOSPITAL_COMMUNITY)
Admission: RE | Admit: 2013-05-21 | Discharge: 2013-05-21 | Disposition: A | Discharge status: Home / Self Care | Payer: Medicaid Other | Source: Ambulatory Visit | Attending: Orthopaedic Surgery | Admitting: Orthopaedic Surgery

## 2013-05-21 DIAGNOSIS — M25511 Pain in right shoulder: Secondary | ICD-10-CM

## 2013-05-21 DIAGNOSIS — M25519 Pain in unspecified shoulder: Secondary | ICD-10-CM | POA: Insufficient documentation

## 2015-08-19 ENCOUNTER — Emergency Department (HOSPITAL_COMMUNITY)
Admission: EM | Admit: 2015-08-19 | Discharge: 2015-08-19 | Disposition: A | Discharge status: Home / Self Care | Payer: Medicaid Other | Attending: Emergency Medicine | Admitting: Emergency Medicine

## 2015-08-19 ENCOUNTER — Encounter (HOSPITAL_COMMUNITY): Payer: Self-pay | Admitting: Emergency Medicine

## 2015-08-19 DIAGNOSIS — A599 Trichomoniasis, unspecified: Secondary | ICD-10-CM

## 2015-08-19 DIAGNOSIS — K219 Gastro-esophageal reflux disease without esophagitis: Secondary | ICD-10-CM | POA: Diagnosis not present

## 2015-08-19 DIAGNOSIS — Z79899 Other long term (current) drug therapy: Secondary | ICD-10-CM | POA: Insufficient documentation

## 2015-08-19 DIAGNOSIS — E669 Obesity, unspecified: Secondary | ICD-10-CM | POA: Insufficient documentation

## 2015-08-19 DIAGNOSIS — F17210 Nicotine dependence, cigarettes, uncomplicated: Secondary | ICD-10-CM | POA: Diagnosis not present

## 2015-08-19 DIAGNOSIS — Z3202 Encounter for pregnancy test, result negative: Secondary | ICD-10-CM | POA: Insufficient documentation

## 2015-08-19 DIAGNOSIS — A5900 Urogenital trichomoniasis, unspecified: Secondary | ICD-10-CM | POA: Diagnosis not present

## 2015-08-19 DIAGNOSIS — I10 Essential (primary) hypertension: Secondary | ICD-10-CM | POA: Diagnosis not present

## 2015-08-19 DIAGNOSIS — N898 Other specified noninflammatory disorders of vagina: Secondary | ICD-10-CM | POA: Diagnosis present

## 2015-08-19 LAB — URINE MICROSCOPIC-ADD ON

## 2015-08-19 LAB — WET PREP, GENITAL
SPERM: NONE SEEN
YEAST WET PREP: NONE SEEN

## 2015-08-19 LAB — URINALYSIS, ROUTINE W REFLEX MICROSCOPIC
Bilirubin Urine: NEGATIVE
GLUCOSE, UA: NEGATIVE mg/dL
KETONES UR: NEGATIVE mg/dL
Nitrite: NEGATIVE
PH: 6 (ref 5.0–8.0)
Protein, ur: NEGATIVE mg/dL
Specific Gravity, Urine: >1.03 — ABNORMAL HIGH (ref 1.005–1.030)

## 2015-08-19 LAB — PREGNANCY, URINE: Preg Test, Ur: NEGATIVE

## 2015-08-19 MED ORDER — METRONIDAZOLE 500 MG PO TABS
2000.0000 mg | ORAL_TABLET | Freq: Once | ORAL | Status: AC
  Administered 2015-08-19: 2000 mg via ORAL
  Filled 2015-08-19: qty 4

## 2015-08-19 NOTE — ED Notes (Signed)
Pt states, "I think I have a tampon stuck in my vagina." Pt unsure how long it has been in, possibly 3-4 days. Pt states she may have had intercourse with it still in. Pt reports she noticed a foul odor today and is experiencing vaginal pain. Denies n/v. Denies fever/chills.

## 2015-08-19 NOTE — ED Provider Notes (Signed)
CSN: [id number]     Arrival date & time 08/19/15  1341 History   First MD Initiated Contact with Patient 08/19/15 1513     Chief Complaint  Patient presents with  . Foreign Body in Vagina     (Consider location/radiation/quality/duration/timing/severity/associated sxs/prior Treatment) HPI Patient presents with concern of possible retained foreign body in vagina. Patient notes that she has IUD, but several days ago, use a tampon. She states that she was drinking at the time Subsequent, the patient has had intercourse, C-section currently has malodorous discharge, full sensation in her genitals. She denies fever, chills, nausea, vomiting, other focal complaints per  Past Medical History  Diagnosis Date  . Hypertension   . Attention deficit disorder (ADD)   . GERD (gastroesophageal reflux disease)    Past Surgical History  Procedure Laterality Date  . Carpal tunnel release      left-[place]  . Cystoscopy/retrograde/ureteroscopy  05/06/2012    Procedure: CYSTOSCOPY/RETROGRADE/URETEROSCOPY;  Surgeon: Ky BarbanMohammad I Javaid, MD;  Location: [facility];  Service: Urology;  Laterality: Left;   No family history on file. Social History  Substance Use Topics  . Smoking status: Current Some Day Smoker -- 1.00 packs/day for 25 years    Types: Cigarettes  . Smokeless tobacco: None  . Alcohol Use: Yes     Comment: occasional    OB History    No data available     Review of Systems  Constitutional:       Per HPI, otherwise negative  HENT:       Per HPI, otherwise negative  Respiratory:       Per HPI, otherwise negative  Cardiovascular:       Per HPI, otherwise negative  Gastrointestinal: Negative for vomiting.  Endocrine:       Negative aside from HPI  Genitourinary:       Neg aside from HPI   Musculoskeletal:       Per HPI, otherwise negative  Skin: Negative.   Neurological: Negative for syncope.      Allergies  Review of patient's allergies indicates no known  allergies.  Home Medications   Prior to Admission medications   Medication Sig Start Date End Date Taking? Authorizing Provider  amphetamine-dextroamphetamine (ADDERALL XR) 30 MG 24 hr capsule Take 30 mg by mouth every morning.    Historical Provider, MD  ibuprofen (ADVIL,MOTRIN) 200 MG tablet Take 400 mg by mouth every 6 (six) hours as needed. For pain    Historical Provider, MD  lisinopril-hydrochlorothiazide (PRINZIDE,ZESTORETIC) 20-12.5 MG per tablet Take 1 tablet by mouth daily.    Historical Provider, MD  omeprazole (PRILOSEC) 20 MG capsule Take 20 mg by mouth daily.    Historical Provider, MD  oxyCODONE-acetaminophen (PERCOCET) 7.5-325 MG per tablet Take 2 tablets by mouth every 6 (six) hours as needed.    Historical Provider, MD   BP 133/86 mmHg  Pulse 80  Temp(Src) 97.9 F (36.6 C) (Oral)  Resp 16  Ht 5\' 9"  (1.753 m)  Wt 265 lb (120.203 kg)  BMI 39.12 kg/m2  SpO2 98%  LMP 08/15/2015 Physical Exam  Constitutional: She is oriented to person, place, and time. She appears well-developed and well-nourished. No distress.  Obese female, awake, alert, resting in gurney  HENT:  Head: Normocephalic and atraumatic.  Eyes: Conjunctivae and EOM are normal.  Cardiovascular: Normal rate and regular rhythm.   Pulmonary/Chest: Effort normal and breath sounds normal. No stridor. No respiratory distress.  Abdominal: She exhibits no distension. There is no tenderness.  There is no rebound.  Genitourinary:    There is no rash, tenderness or lesion on the right labia. There is no rash, tenderness or lesion on the left labia. No erythema, tenderness or bleeding in the vagina. No foreign body around the vagina. Vaginal discharge found.  Musculoskeletal: She exhibits no edema.  Neurological: She is alert and oriented to person, place, and time. No cranial nerve deficit.  Skin: Skin is warm and dry.  Psychiatric: She has a normal mood and affect.  Nursing note and vitals reviewed.   ED Course   Procedures (including critical care time) Labs Review Labs Reviewed  URINALYSIS, ROUTINE W REFLEX MICROSCOPIC (NOT AT [facility]) - Abnormal; Notable for the following:    APPearance HAZY (*)    Specific Gravity, Urine >1.030 (*)    Hgb urine dipstick TRACE (*)    Leukocytes, UA MODERATE (*)    All other components within normal limits  URINE MICROSCOPIC-ADD ON - Abnormal; Notable for the following:    Squamous Epithelial / LPF TOO NUMEROUS TO COUNT (*)    Bacteria, UA MANY (*)    All other components within normal limits  PREGNANCY, URINE  WET PREP  (BD AFFIRM) ([place])    Imaging Review No results found. I have personally reviewed and evaluated these images and lab results as part of my medical decision-making.   EKG Interpretation None     Following initial evaluation, without visualization of a foreign body, performed bedside ultrasound. This also did not demonstrate obvious foreign body.   EMERGENCY DEPARTMENT [place] ABD/AORTA EXAM Study: Limited Ultrasound of the Abdominal Aorta.  INDICATIONS:Abdominal pain Indication: Multiple views of the abdominal aorta are obtained from the diaphragmatic hiatus to the aortic bifurcation in transverse and sagittal planes with a multi- Frequency probe.  PERFORMED BY: Myself  IMAGES ARCHIVED?: No  FINDINGS: Free fluid absent  LIMITATIONS:  Body habitus  INTERPRETATION:  Abdominal free fluid absent  COMMENT:  No visible FB in uterus or vagina   On repeat exam the patient appears calm. Return precautions, follow-up instructions discussed, findings discussed.  MDM  Patient presents with malodorous discharge, return for possible retained foreign body in the vagina. Patient is awake, alert, with a soft, non-peritoneal abdomen. Patient is afebrile, there is low suspicion for toxic shock syndrome, or systemic infection. No visualization foreign body, on ultrasound or directly, and the patient's symptoms may better be explained by  her bacterial vaginosis. Patient started on medication, discharged with exposer return precautions, follow-up instructions.  Gerhard Munch, MD 08/19/15 1728

## 2015-08-19 NOTE — Discharge Instructions (Signed)
As discussed, your evaluation today has resulted in a diagnosis of trichomoniasis.  Trichomoniasis Trichomoniasis is an infection caused by an organism called Trichomonas. The infection can affect both women and men. In women, the outer female genitalia and the vagina are affected. In men, the penis is mainly affected, but the prostate and other reproductive organs can also be involved. Trichomoniasis is a sexually transmitted infection (STI) and is most often passed to another person through sexual contact.  RISK FACTORS  Having unprotected sexual intercourse.  Having sexual intercourse with an infected partner. SIGNS AND SYMPTOMS  Symptoms of trichomoniasis in women include:  Abnormal gray-green frothy vaginal discharge.  Itching and irritation of the vagina.  Itching and irritation of the area outside the vagina. Symptoms of trichomoniasis in men include:   Penile discharge with or without pain.  Pain during urination. This results from inflammation of the urethra. DIAGNOSIS  Trichomoniasis may be found during a Pap test or physical exam. Your health care provider may use one of the following methods to help diagnose this infection:  Testing the pH of the vagina with a test tape.  Using a vaginal swab test that checks for the Trichomonas organism. A test is available that provides results within a few minutes.  Examining a urine sample.  Testing vaginal secretions. Your health care provider may test you for other STIs, including HIV. TREATMENT   You may be given medicine to fight the infection. Women should inform their health care provider if they could be or are pregnant. Some medicines used to treat the infection should not be taken during pregnancy.  Your health care provider may recommend over-the-counter medicines or creams to decrease itching or irritation.  Your sexual partner will need to be treated if infected.  Your health care provider may test you for  infection again 3 months after treatment. HOME CARE INSTRUCTIONS   Take medicines only as directed by your health care provider.  Take over-the-counter medicine for itching or irritation as directed by your health care provider.  Do not have sexual intercourse while you have the infection.  Women should not douche or wear tampons while they have the infection.  Discuss your infection with your partner. Your partner may have gotten the infection from you, or you may have gotten it from your partner.  Have your sex partner get examined and treated if necessary.  Practice safe, informed, and protected sex.  See your health care provider for other STI testing. SEEK MEDICAL CARE IF:   You still have symptoms after you finish your medicine.  You develop abdominal pain.  You have pain when you urinate.  You have bleeding after sexual intercourse.  You develop a rash.  Your medicine makes you sick or makes you throw up (vomit). MAKE SURE YOU:  Understand these instructions.  Will watch your condition.  Will get help right away if you are not doing well or get worse.   This information is not intended to replace advice given to you by your health care provider. Make sure you discuss any questions you have with your health care provider.   Document Released: 02/20/2001 Document Revised: 09/17/2014 Document Reviewed: 06/08/2013 Elsevier Interactive Patient Education 2016 [employer]  Please monitor your condition carefully, and if you develop any new, or concerning changes in her condition, please return here or proceed immediately to [facility].

## 2015-08-22 LAB — GC/CHLAMYDIA PROBE AMP (~~LOC~~) NOT AT ARMC
Chlamydia: NEGATIVE
Neisseria Gonorrhea: NEGATIVE

## 2017-04-12 ENCOUNTER — Emergency Department: Payer: Self-pay

## 2017-04-12 ENCOUNTER — Encounter: Payer: Self-pay | Admitting: Emergency Medicine

## 2017-04-12 ENCOUNTER — Emergency Department
Admission: EM | Admit: 2017-04-12 | Discharge: 2017-04-12 | Disposition: A | Discharge status: Home / Self Care | Payer: Self-pay | Attending: Emergency Medicine | Admitting: Emergency Medicine

## 2017-04-12 DIAGNOSIS — F17210 Nicotine dependence, cigarettes, uncomplicated: Secondary | ICD-10-CM | POA: Insufficient documentation

## 2017-04-12 DIAGNOSIS — Y999 Unspecified external cause status: Secondary | ICD-10-CM | POA: Insufficient documentation

## 2017-04-12 DIAGNOSIS — W07XXXA Fall from chair, initial encounter: Secondary | ICD-10-CM | POA: Insufficient documentation

## 2017-04-12 DIAGNOSIS — W2209XA Striking against other stationary object, initial encounter: Secondary | ICD-10-CM | POA: Insufficient documentation

## 2017-04-12 DIAGNOSIS — Z79899 Other long term (current) drug therapy: Secondary | ICD-10-CM | POA: Insufficient documentation

## 2017-04-12 DIAGNOSIS — Y929 Unspecified place or not applicable: Secondary | ICD-10-CM | POA: Insufficient documentation

## 2017-04-12 DIAGNOSIS — S32009A Unspecified fracture of unspecified lumbar vertebra, initial encounter for closed fracture: Secondary | ICD-10-CM

## 2017-04-12 DIAGNOSIS — I10 Essential (primary) hypertension: Secondary | ICD-10-CM | POA: Insufficient documentation

## 2017-04-12 DIAGNOSIS — Y939 Activity, unspecified: Secondary | ICD-10-CM | POA: Insufficient documentation

## 2017-04-12 DIAGNOSIS — S32019A Unspecified fracture of first lumbar vertebra, initial encounter for closed fracture: Secondary | ICD-10-CM | POA: Insufficient documentation

## 2017-04-12 LAB — POCT PREGNANCY, URINE: PREG TEST UR: NEGATIVE

## 2017-04-12 MED ORDER — LIDOCAINE 5 % EX PTCH
2.0000 | MEDICATED_PATCH | CUTANEOUS | Status: DC
  Administered 2017-04-12: 2 via TRANSDERMAL
  Filled 2017-04-12: qty 2

## 2017-04-12 MED ORDER — OXYCODONE-ACETAMINOPHEN 5-325 MG PO TABS
1.0000 | ORAL_TABLET | Freq: Once | ORAL | Status: AC
  Administered 2017-04-12: 1 via ORAL
  Filled 2017-04-12: qty 1

## 2017-04-12 MED ORDER — OXYCODONE-ACETAMINOPHEN 5-325 MG PO TABS: 1.0000 | ORAL_TABLET | ORAL | 0 refills | Status: DC | PRN

## 2017-04-12 NOTE — ED Notes (Signed)
Patient transported to X-ray at this time 

## 2017-04-12 NOTE — ED Notes (Signed)
Patient has bruise to med/medial back

## 2017-04-12 NOTE — ED Notes (Signed)
Patient transported to X-ray 

## 2017-04-12 NOTE — ED Triage Notes (Signed)
Patient ambulatory to triage with steady gait, without difficulty or distress noted; pt reports fell out of rolling chair hitting back on a crate few days ago and cont to have lower back pain

## 2017-04-12 NOTE — ED Notes (Signed)
MD Brown at bedside.

## 2017-04-12 NOTE — ED Notes (Signed)
Pt verbalized understanding of discharge instructions. NAD at this time. 

## 2017-04-12 NOTE — ED Provider Notes (Signed)
[facility] Emergency Department Provider Note   First MD Initiated Contact with Patient 04/12/17 0530     (approximate)  I have reviewed the triage vital signs and the nursing notes.   HISTORY  Chief Complaint Back Pain   HPI Tracie Bailey is a 46 y.o. female with low list of chronic medical conditions presents to the emergency department with history of accidental fall out of a chair with resultant back injury 3 days ago. Patient states that she attempts to sit onto a chair with that had wheels rejection on slit from under her and she struck her lower back against a crate. Patient states her current pain score is 8 out of 10 worse with any movement. Patient admits to taking ibuprofen for pain at home without any relief.   Past Medical History:  Diagnosis Date  . Attention deficit disorder (ADD)   . GERD (gastroesophageal reflux disease)   . Hypertension     There are no active problems to display for this patient.   Past Surgical History:  Procedure Laterality Date  . CARPAL TUNNEL RELEASE     left-[place]  . CYSTOSCOPY/RETROGRADE/URETEROSCOPY  05/06/2012   Procedure: CYSTOSCOPY/RETROGRADE/URETEROSCOPY;  Surgeon: Ky BarbanMohammad I Javaid, MD;  Location: [facility];  Service: Urology;  Laterality: Left;    Prior to Admission medications   Medication Sig Start Date End Date Taking? Authorizing Provider  amphetamine-dextroamphetamine (ADDERALL XR) 30 MG 24 hr capsule Take 30 mg by mouth every morning.    [provider]  ibuprofen (ADVIL,MOTRIN) 200 MG tablet Take 400 mg by mouth every 6 (six) hours as needed. For pain    [provider]  lisinopril-hydrochlorothiazide (PRINZIDE,ZESTORETIC) 20-12.5 MG per tablet Take 1 tablet by mouth daily.    [provider]  omeprazole (PRILOSEC) 20 MG capsule Take 20 mg by mouth daily.    [provider]  oxyCODONE-acetaminophen (PERCOCET) 7.5-325 MG per tablet Take 2 tablets by mouth  every 6 (six) hours as needed.    [provider]  oxyCODONE-acetaminophen (ROXICET) 5-325 MG tablet Take 1 tablet by mouth every 4 (four) hours as needed for severe pain. 04/12/17   Darci CurrentBrown, [place] N, MD    Allergies No known drug allergies No family history on file.  Social History Social History  Substance Use Topics  . Smoking status: Current Some Day Smoker    Packs/day: 1.00    Years: 25.00    Types: Cigarettes  . Smokeless tobacco: Never Used  . Alcohol use Yes     Comment: occasional     Review of Systems Constitutional: No fever/chills Eyes: No visual changes. ENT: No sore throat. Cardiovascular: Denies chest pain. Respiratory: Denies shortness of breath. Gastrointestinal: No abdominal pain.  No nausea, no vomiting.  No diarrhea.  No constipation. Genitourinary: Negative for dysuria. Musculoskeletal: Negative for neck pain.  Positive for back pain. Integumentary: Negative for rash. Neurological: Negative for headaches, focal weakness or numbness.   ____________________________________________   PHYSICAL EXAM:  VITAL SIGNS: ED Triage Vitals  Enc Vitals Group     BP 04/12/17 0534 (!) 132/97     Pulse Rate 04/12/17 0534 97     Resp 04/12/17 0534 18     Temp 04/12/17 0534 98.4 F (36.9 C)     Temp Source 04/12/17 0534 Oral     SpO2 04/12/17 0534 98 %     Weight 04/12/17 0527 122.5 kg (270 lb)     Height 04/12/17 0527 1.753 m (5\' 9" )  Head Circumference --      Peak Flow --      Pain Score 04/12/17 0526 6     Pain Loc --      Pain Edu? --      Excl. in GC? --     Constitutional: Alert and oriented. Well appearing and in no acute distress. Eyes: Conjunctivae are normal.  Head: Atraumatic. Mouth/Throat: Mucous membranes are moist. Oropharynx non-erythematous. Neck: No stridor.   Cardiovascular: Normal rate, regular rhythm. Good peripheral circulation. Grossly normal heart sounds. Respiratory: Normal respiratory effort.  No retractions. Lungs  CTAB. Gastrointestinal: Soft and nontender. No distention.  Musculoskeletal: No lower extremity tenderness nor edema. No gross deformities of extremities.Pain to palpation L1-L3 Neurologic:  Normal speech and language. No gross focal neurologic deficits are appreciated.  Skin:  Skin is warm, dry and intact. Ecchymoses noted along L1-L3 left paraspinal muscles  Psychiatric: Mood and affect are normal. Speech and behavior are normal.  _  RADIOLOGY I, [place] N Lamika Connolly, personally viewed and evaluated these images (plain radiographs) as part of my medical decision making, as well as reviewing the written report by the radiologist.  Dg Lumbar Spine Complete  Result Date: 04/12/2017 CLINICAL DATA:  Patient fell off a chair a few days ago and continues to have pain. Pain and ecchymosis over L1 through 3. EXAM: LUMBAR SPINE - COMPLETE 4+ VIEW COMPARISON:  CT abdomen and pelvis 04/07/2012 FINDINGS: Suggestion of a fracture of the right transverse process of L1. Normal alignment of the lumbar spine. Mild wedge deformities of T11 and T12 without change since prior study. Degenerative changes throughout the lumbar spine with narrowed interspaces and endplate hypertrophic changes. No focal bone lesion or bone destruction. Visualized sacrum appears intact. Calcification projected to the right of L2 could represent a renal stone. Multiple ovoid densities in the upper abdomen likely representing ingested medication in the stomach. IMPRESSION: Degenerative changes throughout the lumbar spine. Normal alignment. Suggestion of a fracture of the right transverse process of L1. Electronically Signed   By: Burman NievesWilliam  Stevens M.D.   On: 04/12/2017 06:48      Procedures   ____________________________________________   INITIAL IMPRESSION / ASSESSMENT AND PLAN / ED COURSE  Pertinent labs & imaging results that were available during my care of the patient were reviewed by me and considered in my medical decision making  (see chart for details).  Patient given a Percocet in the emergency department Lidoderm patch applied. Patient will be prescribed Percocet for home. Patient referred to Dr. Joice LoftsPoggi  orthopedic surgeon      ____________________________________________  FINAL CLINICAL IMPRESSION(S) / ED DIAGNOSES  Final diagnoses:  Closed fracture of transverse process of lumbar vertebra, initial encounter [facility](HCC)     MEDICATIONS GIVEN DURING THIS VISIT:  Medications  lidocaine (LIDODERM) 5 % 2 patch (2 patches Transdermal Patch Applied 04/12/17 0624)  oxyCODONE-acetaminophen (PERCOCET/ROXICET) 5-325 MG per tablet 1 tablet (1 tablet Oral Given 04/12/17 0624)     NEW OUTPATIENT MEDICATIONS STARTED DURING THIS VISIT:  New Prescriptions   OXYCODONE-ACETAMINOPHEN (ROXICET) 5-325 MG TABLET    Take 1 tablet by mouth every 4 (four) hours as needed for severe pain.    Modified Medications   No medications on file    Discontinued Medications   No medications on file     Note:  This document was prepared using Dragon voice recognition software and may include unintentional dictation errors.    Darci CurrentBrown, [place] N, MD 04/12/17 [phone]

## 2018-04-20 ENCOUNTER — Encounter: Payer: Self-pay | Admitting: Emergency Medicine

## 2018-04-20 ENCOUNTER — Emergency Department: Payer: Self-pay

## 2018-04-20 ENCOUNTER — Emergency Department
Admission: EM | Admit: 2018-04-20 | Discharge: 2018-04-20 | Disposition: A | Discharge status: Home / Self Care | Payer: Self-pay | Attending: Emergency Medicine | Admitting: Emergency Medicine

## 2018-04-20 ENCOUNTER — Other Ambulatory Visit: Payer: Self-pay

## 2018-04-20 DIAGNOSIS — L03211 Cellulitis of face: Secondary | ICD-10-CM | POA: Insufficient documentation

## 2018-04-20 DIAGNOSIS — Z79899 Other long term (current) drug therapy: Secondary | ICD-10-CM | POA: Insufficient documentation

## 2018-04-20 DIAGNOSIS — F17210 Nicotine dependence, cigarettes, uncomplicated: Secondary | ICD-10-CM | POA: Insufficient documentation

## 2018-04-20 DIAGNOSIS — I10 Essential (primary) hypertension: Secondary | ICD-10-CM | POA: Insufficient documentation

## 2018-04-20 DIAGNOSIS — K130 Diseases of lips: Secondary | ICD-10-CM | POA: Insufficient documentation

## 2018-04-20 LAB — CBC WITH DIFFERENTIAL/PLATELET
BASOS ABS: 0.1 10*3/uL (ref 0–0.1)
Basophils Relative: 1 %
Eosinophils Absolute: 0.2 10*3/uL (ref 0–0.7)
Eosinophils Relative: 1 %
HCT: 42.8 % (ref 35.0–47.0)
HEMOGLOBIN: 14.8 g/dL (ref 12.0–16.0)
LYMPHS ABS: 2.4 10*3/uL (ref 1.0–3.6)
LYMPHS PCT: 18 %
MCH: 27.4 pg (ref 26.0–34.0)
MCHC: 34.6 g/dL (ref 32.0–36.0)
MCV: 79 fL — AB (ref 80.0–100.0)
Monocytes Absolute: 0.9 10*3/uL (ref 0.2–0.9)
Monocytes Relative: 7 %
NEUTROS PCT: 73 %
Neutro Abs: 10 10*3/uL — ABNORMAL HIGH (ref 1.4–6.5)
Platelets: 350 10*3/uL (ref 150–440)
RBC: 5.42 MIL/uL — AB (ref 3.80–5.20)
RDW: 15.4 % — ABNORMAL HIGH (ref 11.5–14.5)
WBC: 13.6 10*3/uL — AB (ref 3.6–11.0)

## 2018-04-20 LAB — COMPREHENSIVE METABOLIC PANEL
ALBUMIN: 4.2 g/dL (ref 3.5–5.0)
ALT: 21 U/L (ref 0–44)
AST: 21 U/L (ref 15–41)
Alkaline Phosphatase: 109 U/L (ref 38–126)
Anion gap: 8 (ref 5–15)
BILIRUBIN TOTAL: 0.6 mg/dL (ref 0.3–1.2)
BUN: 12 mg/dL (ref 6–20)
CHLORIDE: 107 mmol/L (ref 98–111)
CO2: 27 mmol/L (ref 22–32)
CREATININE: 0.88 mg/dL (ref 0.44–1.00)
Calcium: 9.5 mg/dL (ref 8.9–10.3)
GFR calc Af Amer: >60 mL/min (ref >60)
GFR calc non Af Amer: >60 mL/min (ref >60)
GLUCOSE: 88 mg/dL (ref 70–99)
Potassium: 3 mmol/L — ABNORMAL LOW (ref 3.5–5.1)
Sodium: 142 mmol/L (ref 135–145)
Total Protein: 8.1 g/dL (ref 6.5–8.1)

## 2018-04-20 LAB — LACTIC ACID, PLASMA: LACTIC ACID, VENOUS: 0.9 mmol/L (ref 0.5–1.9)

## 2018-04-20 MED ORDER — KETOROLAC TROMETHAMINE 30 MG/ML IJ SOLN
15.0000 mg | Freq: Once | INTRAMUSCULAR | Status: AC
  Administered 2018-04-20: 15 mg via INTRAVENOUS

## 2018-04-20 MED ORDER — BUPIVACAINE HCL (PF) 0.5 % IJ SOLN: 30.0000 mL | Freq: Once | INTRAMUSCULAR | Status: DC

## 2018-04-20 MED ORDER — MORPHINE SULFATE (PF) 4 MG/ML IV SOLN
INTRAVENOUS | Status: AC
  Administered 2018-04-20: 6 mg via INTRAVENOUS
  Filled 2018-04-20: qty 1

## 2018-04-20 MED ORDER — FENTANYL CITRATE (PF) 100 MCG/2ML IJ SOLN
100.0000 ug | Freq: Once | INTRAMUSCULAR | Status: AC
  Administered 2018-04-20: 100 ug via INTRAVENOUS
  Filled 2018-04-20: qty 2

## 2018-04-20 MED ORDER — IOHEXOL 300 MG/ML  SOLN
75.0000 mL | Freq: Once | INTRAMUSCULAR | Status: AC | PRN
  Administered 2018-04-20: 75 mL via INTRAVENOUS

## 2018-04-20 MED ORDER — HYDROCODONE-ACETAMINOPHEN 5-325 MG PO TABS: 1.0000 | ORAL_TABLET | Freq: Four times a day (QID) | ORAL | 0 refills | Status: AC | PRN

## 2018-04-20 MED ORDER — MORPHINE SULFATE (PF) 4 MG/ML IV SOLN
6.0000 mg | Freq: Once | INTRAVENOUS | Status: AC
  Administered 2018-04-20: 6 mg via INTRAVENOUS

## 2018-04-20 MED ORDER — ONDANSETRON HCL 4 MG/2ML IJ SOLN
INTRAMUSCULAR | Status: AC
  Administered 2018-04-20: 4 mg via INTRAVENOUS
  Filled 2018-04-20: qty 2

## 2018-04-20 MED ORDER — MORPHINE SULFATE (PF) 2 MG/ML IV SOLN
INTRAVENOUS | Status: AC
  Filled 2018-04-20: qty 1

## 2018-04-20 MED ORDER — KETOROLAC TROMETHAMINE 30 MG/ML IJ SOLN
INTRAMUSCULAR | Status: AC
  Administered 2018-04-20: 15 mg via INTRAVENOUS
  Filled 2018-04-20: qty 1

## 2018-04-20 MED ORDER — SODIUM CHLORIDE 0.9 % IV BOLUS
1000.0000 mL | Freq: Once | INTRAVENOUS | Status: AC
  Administered 2018-04-20: 1000 mL via INTRAVENOUS

## 2018-04-20 MED ORDER — BUPIVACAINE HCL (PF) 0.5 % IJ SOLN
INTRAMUSCULAR | Status: AC
  Filled 2018-04-20: qty 30

## 2018-04-20 MED ORDER — LIDOCAINE HCL (PF) 1 % IJ SOLN: 5.0000 mL | Freq: Once | INTRAMUSCULAR | Status: DC

## 2018-04-20 MED ORDER — CEFTRIAXONE SODIUM 2 G IJ SOLR
2.0000 g | Freq: Once | INTRAMUSCULAR | Status: AC
  Administered 2018-04-20: 2 g via INTRAVENOUS
  Filled 2018-04-20: qty 20

## 2018-04-20 MED ORDER — AMOXICILLIN-POT CLAVULANATE 875-125 MG PO TABS: 1.0000 | ORAL_TABLET | Freq: Two times a day (BID) | ORAL | 0 refills | Status: AC

## 2018-04-20 MED ORDER — SULFAMETHOXAZOLE-TRIMETHOPRIM 800-160 MG PO TABS: 1.0000 | ORAL_TABLET | Freq: Two times a day (BID) | ORAL | 0 refills | Status: AC

## 2018-04-20 MED ORDER — ONDANSETRON HCL 4 MG/2ML IJ SOLN
4.0000 mg | Freq: Once | INTRAMUSCULAR | Status: AC
  Administered 2018-04-20: 4 mg via INTRAVENOUS

## 2018-04-20 MED ORDER — VANCOMYCIN HCL IN DEXTROSE 1-5 GM/200ML-% IV SOLN: 1000.0000 mg | Freq: Once | INTRAVENOUS | Status: DC

## 2018-04-20 NOTE — Discharge Instructions (Signed)
Please take all of your antibiotics as prescribed and follow-up with the ear nose and throat physician in 2 days for recheck.  Return to the emergency department sooner for any new or worsening symptoms such as fevers, chills, worsening pain, or for any other issues whatsoever.  It was a pleasure to take care of you today, and thank you for coming to our emergency department.  If you have any questions or concerns before leaving please ask the nurse to grab me and I'm more than happy to go through your aftercare instructions again.  If you were prescribed any opioid pain medication today such as Norco, Vicodin, Percocet, morphine, hydrocodone, or oxycodone please make sure you do not drive when you are taking this medication as it can alter your ability to drive safely.  If you have any concerns once you are home that you are not improving or are in fact getting worse before you can make it to your follow-up appointment, please do not hesitate to call 911 and come back for further evaluation.  Merrily Brittle, MD  Results for orders placed or performed during the hospital encounter of 04/20/18  CBC with Differential  Result Value Ref Range   WBC 13.6 (H) 3.6 - 11.0 K/uL   RBC 5.42 (H) 3.80 - 5.20 MIL/uL   Hemoglobin 14.8 12.0 - 16.0 g/dL   HCT 16.1 09.6 - 04.5 %   MCV 79.0 (L) 80.0 - 100.0 fL   MCH 27.4 26.0 - 34.0 pg   MCHC 34.6 32.0 - 36.0 g/dL   RDW 40.9 (H) 81.1 - 91.4 %   Platelets 350 150 - 440 K/uL   Neutrophils Relative % 73 %   Neutro Abs 10.0 (H) 1.4 - 6.5 K/uL   Lymphocytes Relative 18 %   Lymphs Abs 2.4 1.0 - 3.6 K/uL   Monocytes Relative 7 %   Monocytes Absolute 0.9 0.2 - 0.9 K/uL   Eosinophils Relative 1 %   Eosinophils Absolute 0.2 0 - 0.7 K/uL   Basophils Relative 1 %   Basophils Absolute 0.1 0 - 0.1 K/uL  Lactic acid, plasma  Result Value Ref Range   Lactic Acid, Venous 0.9 0.5 - 1.9 mmol/L  Comprehensive metabolic panel  Result Value Ref Range   Sodium 142 135 -  145 mmol/L   Potassium 3.0 (L) 3.5 - 5.1 mmol/L   Chloride 107 98 - 111 mmol/L   CO2 27 22 - 32 mmol/L   Glucose, Bld 88 70 - 99 mg/dL   BUN 12 6 - 20 mg/dL   Creatinine, Ser 7.82 0.44 - 1.00 mg/dL   Calcium 9.5 8.9 - 95.6 mg/dL   Total Protein 8.1 6.5 - 8.1 g/dL   Albumin 4.2 3.5 - 5.0 g/dL   AST 21 15 - 41 U/L   ALT 21 0 - 44 U/L   Alkaline Phosphatase 109 38 - 126 U/L   Total Bilirubin 0.6 0.3 - 1.2 mg/dL   GFR calc non Af Amer >60 >60 mL/min   GFR calc Af Amer >60 >60 mL/min   Anion gap 8 5 - 15   Ct Maxillofacial W Contrast  Result Date: 04/20/2018 CLINICAL DATA:  47 y/o F; swelling to the right mouth, cheek, along the right jaw. EXAM: CT MAXILLOFACIAL WITH CONTRAST TECHNIQUE: Multidetector CT imaging of the maxillofacial structures was performed with intravenous contrast. Multiplanar CT image reconstructions were also generated. CONTRAST:  75mL OMNIPAQUE IOHEXOL 300 MG/ML  SOLN COMPARISON:  None. FINDINGS: Osseous: No fracture  or mandibular dislocation. No destructive process. Orbits: Negative. No traumatic or inflammatory finding. Sinuses: Mucous retention cysts within the right maxillary sinus. Additional paranasal sinuses and mastoid air cells are normally aerated. Soft tissues: Rim enhancing collection measuring 10 x 16 x 14 mm in the right upper lobe (series 2, image 61 and series 4, image 20). Extensive inflammation throughout the superficial soft tissues of the right lip, cheek, and along the right mandible. No extension of inflammation into the deep cervical compartments. Reactive lymphadenopathy of right submandibular and upper cervical lymph nodes. Limited intracranial: No findings of cavernous sinus thrombosis. IMPRESSION: 16 mm abscess in the right upper lip. Superficial soft tissue inflammation of the right upper lip, cheek, and along the right mandible. No inflammation in the deep cervical compartments. No evidence of cavernous sinus thrombosis. Electronically Signed   By:  Mitzi HansenLance  [place] M.D.   On: 04/20/2018 04:26

## 2018-04-20 NOTE — ED Triage Notes (Addendum)
Pt says 2-3 days ago she had a bump just above the top right side of her mouth; tried to pop it with a pin; pt now has swelling to right side of mouth, cheek and down along jaw line; skin red, minimal warmth; pt unable to speak of fever; denies difficulty swallowing or breathing;

## 2018-04-20 NOTE — ED Notes (Signed)
Patient transported to CT 

## 2018-04-20 NOTE — ED Notes (Signed)
ED Provider at bedside. 

## 2018-04-20 NOTE — ED Provider Notes (Signed)
[facility] Emergency Department Provider Note  ____________________________________________   First MD Initiated Contact with Patient 04/20/18 [phone]     (approximate)  I have reviewed the triage vital signs and the nursing notes.   HISTORY  Chief Complaint Facial Swelling    HPI Tracie Bailey is a 47 y.o. female who comes to the emergency department  with painful swelling to the right side of her face for 3 days.  She initially had a "pimple" on her face and she stabbed it with a needle at home.  She did not wash her face beforehand.  Ever since then the swelling has gotten progressively worse.  She has not sought medical care until today.  She denies fevers or chills.  She denies double vision or blurred vision.  She denies numbness weakness.  She has moderate to severe throbbing aching pain that is worse with movement and somewhat improved when she is still.  No chest pain shortness of breath abdominal pain nausea or vomiting.   Past Medical History:  Diagnosis Date  . Attention deficit disorder (ADD)   . GERD (gastroesophageal reflux disease)   . Hypertension     There are no active problems to display for this patient.   Past Surgical History:  Procedure Laterality Date  . CARPAL TUNNEL RELEASE     left-[place]  . CYSTOSCOPY/RETROGRADE/URETEROSCOPY  05/06/2012   Procedure: CYSTOSCOPY/RETROGRADE/URETEROSCOPY;  Surgeon: Ky Barban, MD;  Location: [facility];  Service: Urology;  Laterality: Left;    Prior to Admission medications   Medication Sig Start Date End Date Taking? Authorizing Provider  amphetamine-dextroamphetamine (ADDERALL XR) 30 MG 24 hr capsule Take 30 mg by mouth every morning.   Yes [provider]  ibuprofen (ADVIL,MOTRIN) 200 MG tablet Take 400 mg by mouth every 6 (six) hours as needed. For pain   Yes [provider]  lisinopril-hydrochlorothiazide (PRINZIDE,ZESTORETIC) 20-12.5 MG per tablet Take 1 tablet by  mouth daily.   Yes [provider]  omeprazole (PRILOSEC) 20 MG capsule Take 20 mg by mouth daily.   Yes [provider]  amoxicillin-clavulanate (AUGMENTIN) 875-125 MG tablet Take 1 tablet by mouth 2 (two) times daily for 10 days. 04/20/18 04/30/18  Merrily Brittle, MD  HYDROcodone-acetaminophen (NORCO) 5-325 MG tablet Take 1 tablet by mouth every 6 (six) hours as needed for up to 7 doses for severe pain. 04/20/18   Merrily Brittle, MD  sulfamethoxazole-trimethoprim (BACTRIM DS,SEPTRA DS) 800-160 MG tablet Take 1 tablet by mouth 2 (two) times daily. 04/20/18   Merrily Brittle, MD    Allergies Patient has no known allergies.  History reviewed. No pertinent family history.  Social History Social History   Tobacco Use  . Smoking status: Current Some Day Smoker    Packs/day: 1.00    Years: 25.00    Pack years: 25.00    Types: Cigarettes  . Smokeless tobacco: Never Used  Substance Use Topics  . Alcohol use: Yes    Comment: occasional   . Drug use: Not Currently    Types: Marijuana    Review of Systems Constitutional: No fever/chills Eyes: No visual changes. ENT: Positive for facial swelling Cardiovascular: Denies chest pain. Respiratory: Denies shortness of breath. Gastrointestinal: No abdominal pain.  No nausea, no vomiting.  No diarrhea.  No constipation. Genitourinary: Negative for dysuria. Musculoskeletal: Negative for back pain. Skin: Positive for rash. Neurological: Negative for headaches, focal weakness or numbness.   ____________________________________________   PHYSICAL EXAM:  VITAL SIGNS: ED Triage  Vitals  Enc Vitals Group     BP 04/20/18 0216 (!) 146/94     Pulse Rate 04/20/18 0216 93     Resp 04/20/18 0216 18     Temp 04/20/18 0216 98.3 F (36.8 C)     Temp Source 04/20/18 0216 Oral     SpO2 04/20/18 0216 100 %     Weight 04/20/18 0217 270 lb (122.5 kg)     Height 04/20/18 0217 5\' 9"  (1.753 m)     Head Circumference --      Peak Flow  --      Pain Score 04/20/18 0217 8     Pain Loc --      Pain Edu? --      Excl. in GC? --     Constitutional: Alert and oriented x4 appears extremely uncomfortable and tearful Eyes: PERRL EOMI. Head: Atraumatic. Nose: No congestion/rhinnorhea. Mouth/Throat: Upper lip swollen and indurated.  Lateral aspect honey crusting coming to a purulent head Neck: No stridor.   Cardiovascular: Tachycardic rate, regular rhythm. Grossly normal heart sounds.  Good peripheral circulation. Respiratory: Normal respiratory effort.  No retractions. Lungs CTAB and moving good air Gastrointestinal: Soft nontender Musculoskeletal: No lower extremity edema   Neurologic:  Normal speech and language. No gross focal neurologic deficits are appreciated. Pacifically cranial nerves II through XII are intact Skin:  Skin is warm, dry and intact. No rash noted. Psychiatric: Mood and affect are normal. Speech and behavior are normal.    ____________________________________________   DIFFERENTIAL includes but not limited to  Cellulitis, abscess, posterior abscess, cavernous venous thrombosis ____________________________________________   LABS (all labs ordered are listed, but only abnormal results are displayed)  Labs Reviewed  CBC WITH DIFFERENTIAL/PLATELET - Abnormal; Notable for the following components:      Result Value   WBC 13.6 (*)    RBC 5.42 (*)    MCV 79.0 (*)    RDW 15.4 (*)    Neutro Abs 10.0 (*)    All other components within normal limits  COMPREHENSIVE METABOLIC PANEL - Abnormal; Notable for the following components:   Potassium 3.0 (*)    All other components within normal limits  CULTURE, BLOOD (ROUTINE X 2)  CULTURE, BLOOD (ROUTINE X 2)  LACTIC ACID, PLASMA    Lab work reviewed by me with elevated white count concerning for infection __________________________________________  EKG   ____________________________________________  RADIOLOGY  CT max face with IV contrast  consistent with upper lip abscess on the right with superficial cellulitis and no posterior involvement ____________________________________________   PROCEDURES  Procedure(s) performed: Yes  .Nerve Block Date/Time: 04/20/2018 4:51 AM Performed by: Merrily Brittle, MD Authorized by: Merrily Brittle, MD   Consent:    Consent obtained:  Verbal   Consent given by:  Patient   Risks discussed:  Allergic reaction, bleeding, intravenous injection, infection, pain, unsuccessful block and swelling   Alternatives discussed:  Alternative treatment Indications:    Indications:  Pain relief and procedural anesthesia Location:    Body area:  Head   Head nerve blocked: infraorbital.   Laterality:  Right Skin anesthesia (see MAR for exact dosages):    Skin anesthesia method:  None Procedure details (see MAR for exact dosages):    Block needle gauge:  25 G   Anesthetic injected:  Bupivacaine 0.5% w/o epi   Injection procedure:  Anatomic landmarks identified, anatomic landmarks palpated, incremental injection and negative aspiration for blood   Paresthesia:  None Post-procedure details:    Dressing:  None   Outcome:  Pain improved .Marland KitchenIncision and Drainage Date/Time: 04/20/2018 4:51 AM Performed by: Merrily Brittle, MD Authorized by: Merrily Brittle, MD   Consent:    Consent obtained:  Verbal   Consent given by:  Patient   Risks discussed:  Bleeding, incomplete drainage, infection and pain   Alternatives discussed:  Alternative treatment Location:    Type:  Abscess   Size:  3cm   Location:  Mouth   Mouth location: right upper lip. Sedation:    Sedation type:  Anxiolysis Anesthesia (see MAR for exact dosages):    Anesthesia method:  Nerve block Procedure type:    Complexity:  Complex Procedure details:    Needle aspiration: no     Incision types:  Single straight   Incision depth:  Submucosal   Scalpel blade:  11   Wound management:  Probed and deloculated, irrigated with saline,  extensive cleaning and debrided   Drainage:  Bloody and purulent   Drainage amount:  Moderate   Wound treatment:  Wound left open   Packing materials:  None Post-procedure details:    Patient tolerance of procedure:  Tolerated well, no immediate complications    Critical Care performed: no  ____________________________________________   INITIAL IMPRESSION / ASSESSMENT AND PLAN / ED COURSE  Pertinent labs & imaging results that were available during my care of the patient were reviewed by me and considered in my medical decision making (see chart for details).   As part of my medical decision making, I reviewed the following data within the electronic MEDICAL RECORD NUMBER History obtained from family if available, nursing notes, old chart and ekg, as well as notes from prior ED visits.  The patient arrives obviously uncomfortable appearing with large right-sided facial swelling.  Given 6 mg of IV morphine for pain as well as broad labs including a lactic acid.  I do have some concern for cavernous sinus thrombosis given the location of her wound.  CT scan is pending.  Ceftriaxone and vancomycin in the meantime.  Fortunately the patient's CT scan is reassuring and does show a lip abscess but no posterior involvement.  I offered the patient inpatient admission versus outpatient management and she would prefer to try to go home.  I performed a infraorbital nerve block on the right with bupivacaine and then an incision and drainage with a stab incision with an 11 blade on the mucosal surface expressing a large amount of purulent material.  I will cover her with Augmentin for facial bacteria as well as Bactrim for MRSA and 2-day follow-up with ENT.  The patient verbalizes understanding and agreement the plan.      ____________________________________________   FINAL CLINICAL IMPRESSION(S) / ED DIAGNOSES  Final diagnoses:  Lip abscess  Facial cellulitis      NEW MEDICATIONS STARTED  DURING THIS VISIT:  Discharge Medication List as of 04/20/2018  4:40 AM    START taking these medications   Details  amoxicillin-clavulanate (AUGMENTIN) 875-125 MG tablet Take 1 tablet by mouth 2 (two) times daily for 10 days., Starting Sun 04/20/2018, Until Wed 04/30/2018, Print    HYDROcodone-acetaminophen (NORCO) 5-325 MG tablet Take 1 tablet by mouth every 6 (six) hours as needed for up to 7 doses for severe pain., Starting Sun 04/20/2018, Print    sulfamethoxazole-trimethoprim (BACTRIM DS,SEPTRA DS) 800-160 MG tablet Take 1 tablet by mouth 2 (two) times daily., Starting Sun 04/20/2018, Print         Note:  This document was prepared  using Conservation officer, historic buildingsDragon voice recognition software and may include unintentional dictation errors.     Merrily Brittleifenbark, Shaton Lore, MD 04/20/18 [phone]

## 2018-04-25 LAB — CULTURE, BLOOD (ROUTINE X 2)
CULTURE: NO GROWTH
CULTURE: NO GROWTH
SPECIAL REQUESTS: ADEQUATE
Special Requests: ADEQUATE

## 2019-10-16 IMAGING — CT CT MAXILLOFACIAL W/ CM
3 series · 15 of 47 positions shown, 18 images · IV contrast (omnipaque)
Comparison: None.

CLINICAL DATA: 46 y/o F; swelling to the right mouth, cheek, along
the right jaw.

EXAM:
CT MAXILLOFACIAL WITH CONTRAST
TECHNIQUE: Multidetector CT imaging of the maxillofacial structures was
performed with intravenous contrast. Multiplanar CT image
reconstructions were also generated.
CONTRAST:  75mL OMNIPAQUE IOHEXOL 300 MG/ML  SOLN

[Series 2: max soft · axial · 0.35mm/px · z∈[-210,-54]mm · 9 of 92 slices shown, 12 images]
[im 7/92  brain]
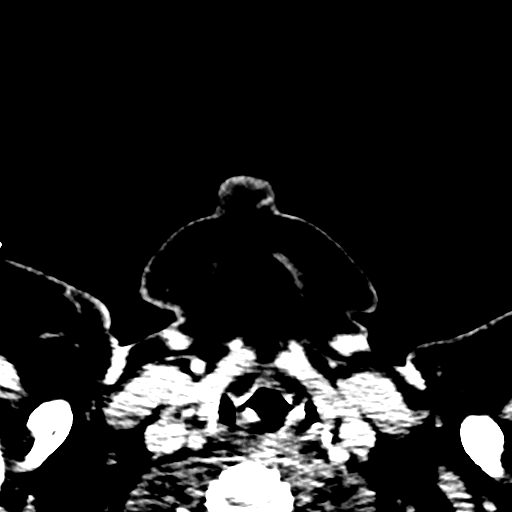
[im 7/92  bone]
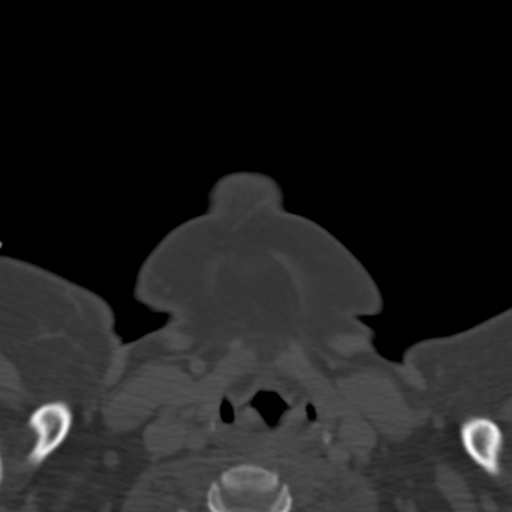
[im 16/92  bone]
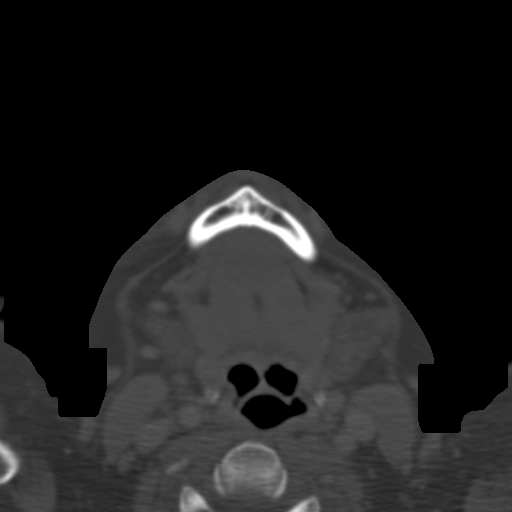
[im 26/92  bone]
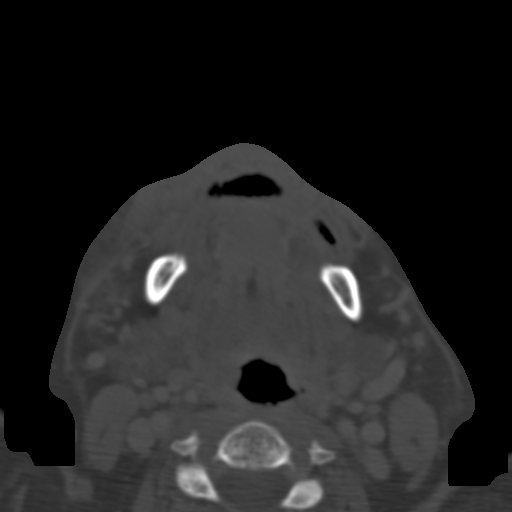
[im 35/92  bone]
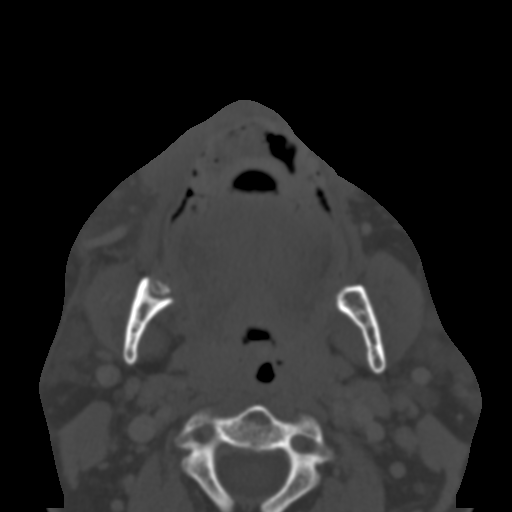
[im 48/92  brain]
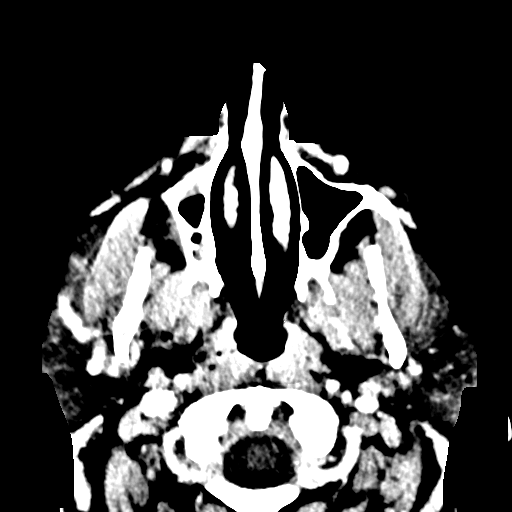
[im 48/92  bone]
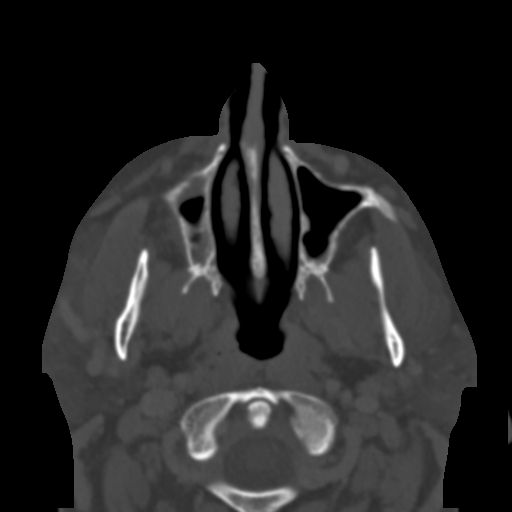
[im 57/92  bone]
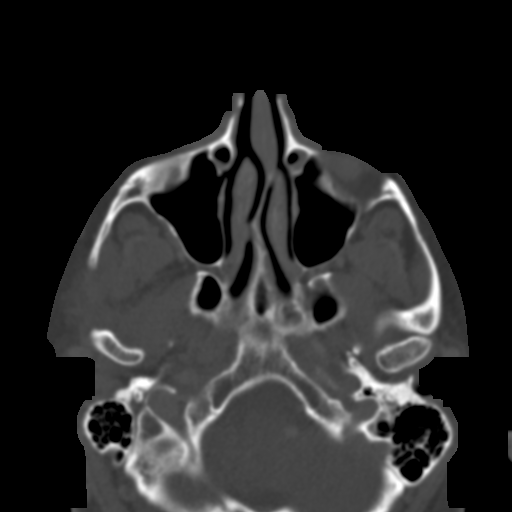
[im 66/92  bone]
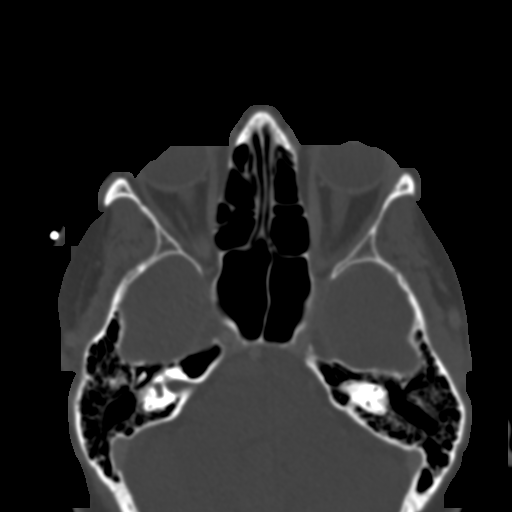
[im 76/92  bone]
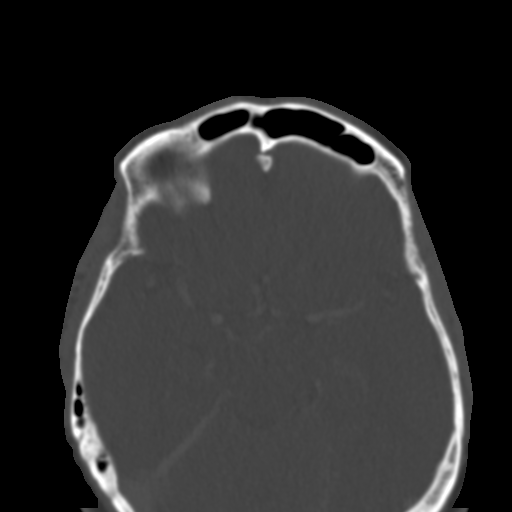
[im 85/92  brain]
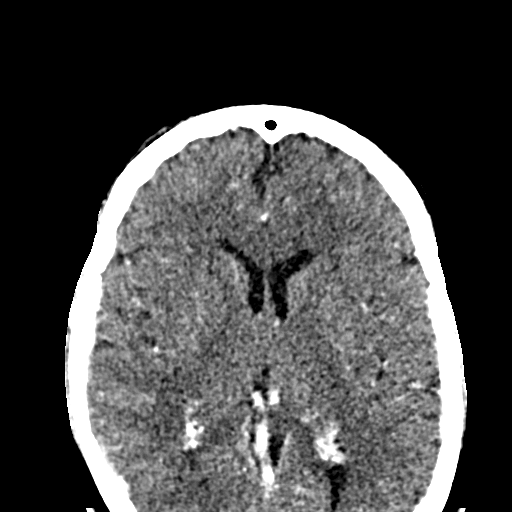
[im 85/92  bone]
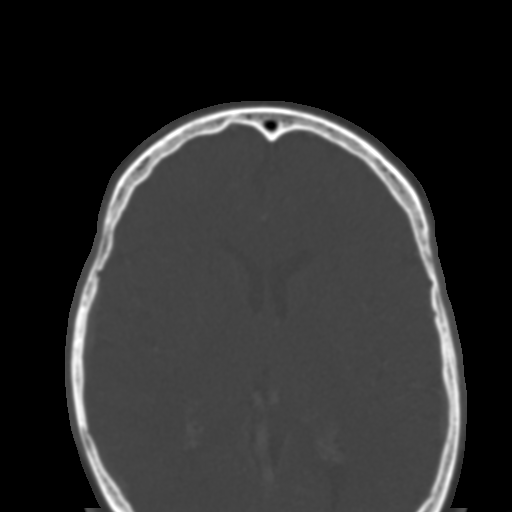

[Series 4: coronal soft · coronal · 0.34mm/px · 3 of 81 slices shown]
[im 27/81  bone]
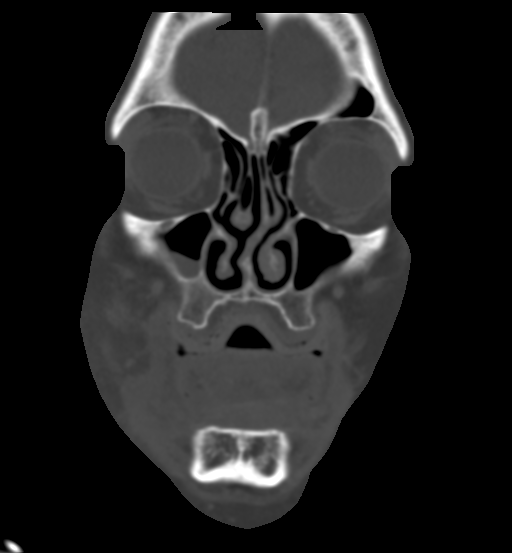
[im 36/81  bone]
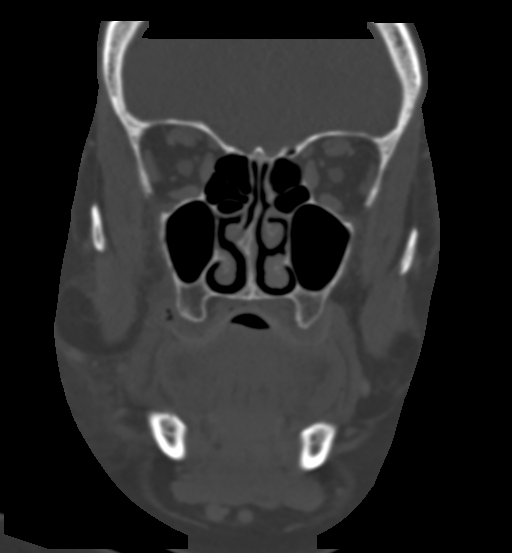
[im 45/81  bone]
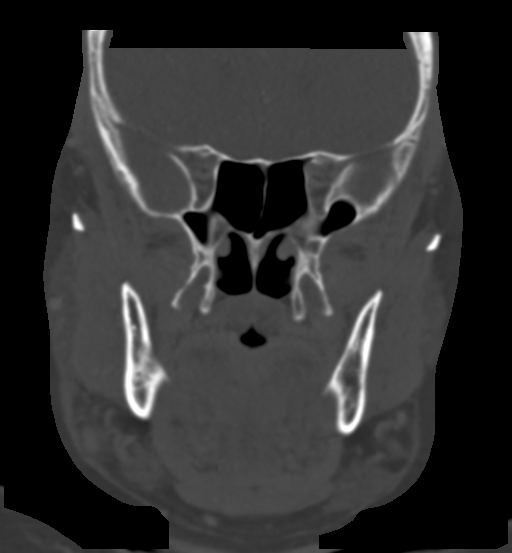

[Series 5: sagittal soft · sagittal · 0.31mm/px · 3 of 88 slices shown]
[im 30/88  bone]
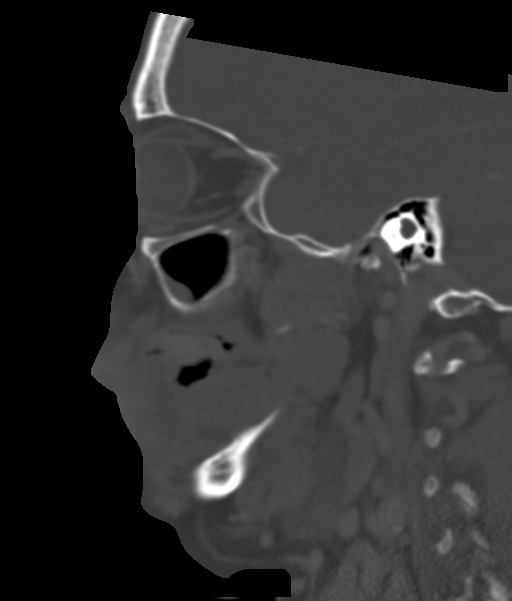
[im 44/88  bone]
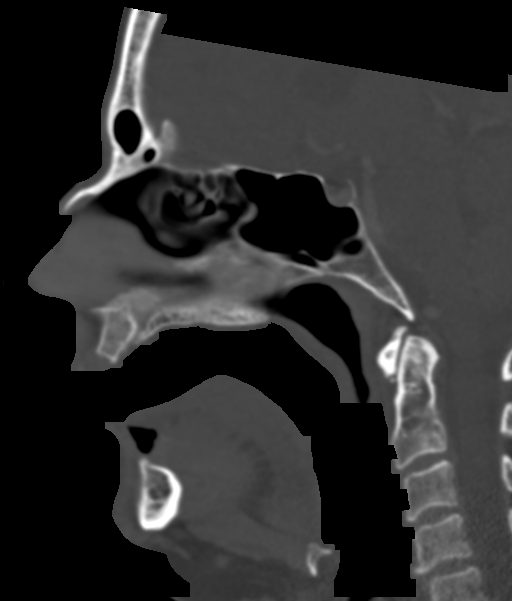
[im 59/88  bone]
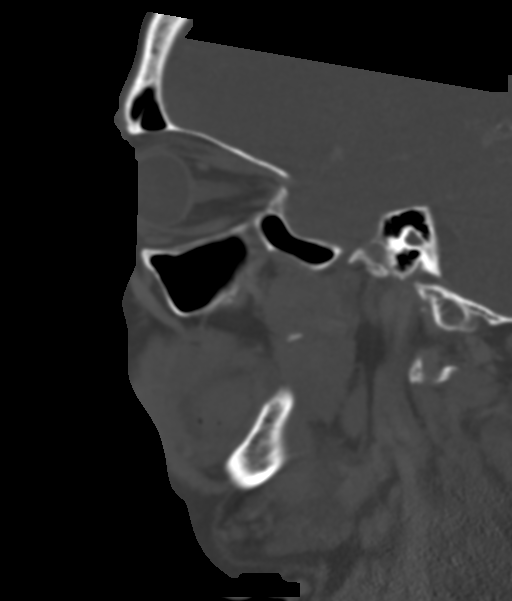

[15 of 47 positions shown; findings below may reference images not displayed]

FINDINGS: Osseous: No fracture or mandibular dislocation. No destructive
process.

Orbits: Negative. No traumatic or inflammatory finding.

Sinuses: Mucous retention cysts within the right maxillary sinus.
Additional paranasal sinuses and mastoid air cells are normally
aerated.

Soft tissues: Rim enhancing collection measuring 10 x 16 x 14 mm in
the right upper lobe (series 2, image 61 and series 4, image 20).
Extensive inflammation throughout the superficial soft tissues of
the right lip, cheek, and along the right mandible. No extension of
inflammation into the deep cervical compartments. Reactive
lymphadenopathy of right submandibular and upper cervical lymph
nodes.

Limited intracranial: No findings of cavernous sinus thrombosis.
IMPRESSION: 16 mm abscess in the right upper lip. Superficial soft tissue
inflammation of the right upper lip, cheek, and along the right
mandible. No inflammation in the deep cervical compartments. No
evidence of cavernous sinus thrombosis.

By: Asrafulislam Jonno M.D.

## 2023-04-11 ENCOUNTER — Emergency Department (HOSPITAL_COMMUNITY)
Admission: EM | Admit: 2023-04-11 | Discharge: 2023-04-12 | Disposition: A | Discharge status: Home / Self Care | Payer: Medicaid Other | Attending: Emergency Medicine | Admitting: Emergency Medicine

## 2023-04-11 ENCOUNTER — Other Ambulatory Visit: Payer: Self-pay

## 2023-04-11 ENCOUNTER — Encounter (HOSPITAL_COMMUNITY): Payer: Self-pay | Admitting: Emergency Medicine

## 2023-04-11 DIAGNOSIS — L0201 Cutaneous abscess of face: Secondary | ICD-10-CM | POA: Insufficient documentation

## 2023-04-11 DIAGNOSIS — L0291 Cutaneous abscess, unspecified: Secondary | ICD-10-CM

## 2023-04-11 NOTE — ED Triage Notes (Signed)
Patient states she had bump on left lower jaw and attempted to pop it yesterday. Site is now red and swollen. Endorses pain at the site.

## 2023-04-12 MED ORDER — CLINDAMYCIN HCL 150 MG PO CAPS: 300.0000 mg | ORAL_CAPSULE | Freq: Four times a day (QID) | ORAL | 0 refills | Status: AC

## 2023-04-12 MED ORDER — CLINDAMYCIN HCL 150 MG PO CAPS
300.0000 mg | ORAL_CAPSULE | Freq: Once | ORAL | Status: AC
  Administered 2023-04-12: 300 mg via ORAL
  Filled 2023-04-12: qty 2

## 2023-04-12 NOTE — ED Provider Notes (Signed)
[place] EMERGENCY DEPARTMENT AT [facility] Provider Note   CSN: [id number] Arrival date & time: 04/11/23  2245     History  Chief Complaint  Patient presents with   Abscess    Jaw     Tracie Bailey is a 52 y.o. female.  Patient presents to the emergency department for evaluation of facial swelling.  Patient reports that it started as a small bump next to the left side of her mouth.  She has been squeezing on this area and applying warm compresses.  She tried to open it up with a needle and after that point the area started become more red and swollen.  No fever.       Home Medications Prior to Admission medications   Medication Sig Start Date End Date Taking? Authorizing Provider  clindamycin (CLEOCIN) 150 MG capsule Take 2 capsules (300 mg total) by mouth 4 (four) times daily. 04/12/23  Yes Panhia Karl, Canary Brim, MD  amphetamine-dextroamphetamine (ADDERALL XR) 30 MG 24 hr capsule Take 30 mg by mouth every morning.    [provider]  HYDROcodone-acetaminophen (NORCO) 5-325 MG tablet Take 1 tablet by mouth every 6 (six) hours as needed for up to 7 doses for severe pain. 04/20/18   Merrily Brittle, MD  ibuprofen (ADVIL,MOTRIN) 200 MG tablet Take 400 mg by mouth every 6 (six) hours as needed. For pain    [provider]  lisinopril-hydrochlorothiazide (PRINZIDE,ZESTORETIC) 20-12.5 MG per tablet Take 1 tablet by mouth daily.    [provider]  omeprazole (PRILOSEC) 20 MG capsule Take 20 mg by mouth daily.    [provider]  sulfamethoxazole-trimethoprim (BACTRIM DS,SEPTRA DS) 800-160 MG tablet Take 1 tablet by mouth 2 (two) times daily. 04/20/18   Merrily Brittle, MD      Allergies    Patient has no known allergies.    Review of Systems   Review of Systems  Physical Exam Updated Vital Signs BP 138/88   Pulse 85   Temp 98 F (36.7 C)   Resp 16   Ht 5\' 9"  (1.753 m)   Wt 124.7 kg   SpO2 97%   BMI 40.61 kg/m  Physical  Exam Vitals and nursing note reviewed.  Constitutional:      General: She is not in acute distress.    Appearance: She is well-developed.  HENT:     Head: Normocephalic and atraumatic.     Mouth/Throat:     Mouth: Mucous membranes are moist.  Eyes:     General: Vision grossly intact. Gaze aligned appropriately.     Extraocular Movements: Extraocular movements intact.     Conjunctiva/sclera: Conjunctivae normal.  Cardiovascular:     Rate and Rhythm: Normal rate and regular rhythm.     Pulses: Normal pulses.     Heart sounds: Normal heart sounds, S1 normal and S2 normal. No murmur heard.    No friction rub. No gallop.  Pulmonary:     Effort: Pulmonary effort is normal. No respiratory distress.     Breath sounds: Normal breath sounds.  Abdominal:     General: Bowel sounds are normal.     Palpations: Abdomen is soft.     Tenderness: There is no abdominal tenderness. There is no guarding or rebound.     Hernia: No hernia is present.  Musculoskeletal:        General: No swelling.     Cervical back: Full passive range of motion without pain, normal range of motion and  neck supple. No spinous process tenderness or muscular tenderness. Normal range of motion.     Right lower leg: No edema.     Left lower leg: No edema.  Skin:    General: Skin is warm and dry.     Capillary Refill: Capillary refill takes less than 2 seconds.     Findings: Erythema present. No ecchymosis, rash or wound.     Comments: Less than 1 cm hard, nonfluctuant nodule felt in the left cheek at the corner of the mouth, surrounding erythema and mild induration.  Central area has a small scab where she has been squeezing.  No fluctuance.  Neurological:     General: No focal deficit present.     Mental Status: She is alert and oriented to person, place, and time.     GCS: GCS eye subscore is 4. GCS verbal subscore is 5. GCS motor subscore is 6.     Cranial Nerves: Cranial nerves 2-12 are intact.     Sensory: Sensation  is intact.     Motor: Motor function is intact.     Coordination: Coordination is intact.  Psychiatric:        Attention and Perception: Attention normal.        Mood and Affect: Mood normal.        Speech: Speech normal.        Behavior: Behavior normal.     ED Results / Procedures / Treatments   Labs (all labs ordered are listed, but only abnormal results are displayed) Labs Reviewed - No data to display  EKG None  Radiology No results found.  Procedures Procedures    Medications Ordered in ED Medications  clindamycin (CLEOCIN) capsule 300 mg (has no administration in time range)    ED Course/ Medical Decision Making/ A&P                                 Medical Decision Making  Differential diagnosis considered includes, but not limited to: Cellulitis; skin abscess; dental abscess  Patient with swelling, redness, hardness to the left side of her mouth.  Patient reports a small bump on the skin that she has been squeezing and then tried to open with a needle.  I do feel a discrete hardness when palpating the left perioral/buccal area.  No fluctuance.  I do not appreciate any drainable abscess.  This is clearly soft tissue in nature, not contiguous with any dental process.  Patient will be initiated on antibiotic coverage, given return precautions.        Final Clinical Impression(s) / ED Diagnoses Final diagnoses:  Abscess    Rx / DC Orders ED Discharge Orders          Ordered    clindamycin (CLEOCIN) 150 MG capsule  4 times daily        04/12/23 0210              Gilda Crease, MD 04/12/23 0210

## 2024-03-15 ENCOUNTER — Emergency Department (HOSPITAL_COMMUNITY)
Admission: EM | Admit: 2024-03-15 | Discharge: 2024-03-15 | Disposition: A | Discharge status: Home / Self Care | Attending: Emergency Medicine | Admitting: Emergency Medicine

## 2024-03-15 ENCOUNTER — Other Ambulatory Visit: Payer: Self-pay

## 2024-03-15 ENCOUNTER — Emergency Department (HOSPITAL_COMMUNITY)

## 2024-03-15 ENCOUNTER — Encounter (HOSPITAL_COMMUNITY): Payer: Self-pay | Admitting: *Deleted

## 2024-03-15 DIAGNOSIS — Y990 Civilian activity done for income or pay: Secondary | ICD-10-CM | POA: Diagnosis not present

## 2024-03-15 DIAGNOSIS — S161XXA Strain of muscle, fascia and tendon at neck level, initial encounter: Secondary | ICD-10-CM | POA: Insufficient documentation

## 2024-03-15 DIAGNOSIS — X500XXA Overexertion from strenuous movement or load, initial encounter: Secondary | ICD-10-CM | POA: Insufficient documentation

## 2024-03-15 DIAGNOSIS — M542 Cervicalgia: Secondary | ICD-10-CM | POA: Diagnosis present

## 2024-03-15 MED ORDER — NAPROXEN 250 MG PO TABS
500.0000 mg | ORAL_TABLET | Freq: Once | ORAL | Status: AC
  Administered 2024-03-15: 500 mg via ORAL
  Filled 2024-03-15: qty 2

## 2024-03-15 MED ORDER — HYDROCODONE-ACETAMINOPHEN 5-325 MG PO TABS
1.0000 | ORAL_TABLET | Freq: Once | ORAL | Status: AC
  Administered 2024-03-15: 1 via ORAL
  Filled 2024-03-15: qty 1

## 2024-03-15 MED ORDER — METHOCARBAMOL 750 MG PO TABS: 750.0000 mg | ORAL_TABLET | Freq: Three times a day (TID) | ORAL | 0 refills | Status: AC

## 2024-03-15 MED ORDER — NAPROXEN 500 MG PO TABS: 500.0000 mg | ORAL_TABLET | Freq: Two times a day (BID) | ORAL | 0 refills | Status: AC

## 2024-03-15 NOTE — ED Triage Notes (Signed)
 Pt states she picked up a generator Friday, c/o neck pain since yesterday and lower back that radiates up back the other day.

## 2024-03-15 NOTE — ED Provider Notes (Signed)
 [place] EMERGENCY DEPARTMENT AT [facility] Provider Note   CSN: [id number] Arrival date & time: 03/15/24  1544     Patient presents with: Neck Pain   Tracie Bailey is a 53 y.o. female.   Patient is a 53 year old female who presents emergency department the chief complaint of neck and back pain.  Patient notes that approximately 3 days ago she attempted to lift a generator and notes that she is been having pain in her back and neck since that time.  Patient notes while at work today the pain did become worse prompting her visit to the emergency department.  Patient notes that she has had no associated urinary bowel incontinence, saddle paresthesias, gait changes, fever, chills.  She denies any abdominal pain, nausea, vomiting, diarrhea.  She denies any dysuria or hematuria.  She denies any abnormal headaches.  She has had no dizziness, lightheadedness or syncope.   Neck Pain      Prior to Admission medications   Medication Sig Start Date End Date Taking? Authorizing Provider  amphetamine-dextroamphetamine (ADDERALL XR) 30 MG 24 hr capsule Take 30 mg by mouth every morning.    [provider]  clindamycin  (CLEOCIN ) 150 MG capsule Take 2 capsules (300 mg total) by mouth 4 (four) times daily. 04/12/23   Haze Lonni JINNY, MD  HYDROcodone -acetaminophen  (NORCO) 5-325 MG tablet Take 1 tablet by mouth every 6 (six) hours as needed for up to 7 doses for severe pain. 04/20/18   Judd Betters, MD  ibuprofen (ADVIL,MOTRIN) 200 MG tablet Take 400 mg by mouth every 6 (six) hours as needed. For pain    [provider]  lisinopril-hydrochlorothiazide (PRINZIDE,ZESTORETIC) 20-12.5 MG per tablet Take 1 tablet by mouth daily.    [provider]  omeprazole (PRILOSEC) 20 MG capsule Take 20 mg by mouth daily.    [provider]  sulfamethoxazole -trimethoprim  (BACTRIM  DS,SEPTRA  DS) 800-160 MG tablet Take 1 tablet by mouth 2 (two) times daily. 04/20/18    Judd Betters, MD    Allergies: Patient has no known allergies.    Review of Systems  Musculoskeletal:  Positive for back pain and neck pain.  All other systems reviewed and are negative.   Updated Vital Signs BP 101/72 (BP Location: Right Arm)   Pulse 96   Temp 97.8 F (36.6 C) (Oral)   Resp 20   Ht 5' 7 (1.702 m)   Wt 117.9 kg   SpO2 99%   BMI 40.72 kg/m   Physical Exam Vitals and nursing note reviewed.  Constitutional:      General: She is not in acute distress.    Appearance: Normal appearance. She is not ill-appearing.  HENT:     Head: Normocephalic and atraumatic.     Nose: Nose normal.     Mouth/Throat:     Mouth: Mucous membranes are moist.  Eyes:     Extraocular Movements: Extraocular movements intact.     Conjunctiva/sclera: Conjunctivae normal.     Pupils: Pupils are equal, round, and reactive to light.  Neck:     Comments: No midline tenderness, no step-off or deformity, tenderness palpation over bilateral trapezius muscles Cardiovascular:     Rate and Rhythm: Normal rate and regular rhythm.     Pulses: Normal pulses.     Heart sounds: Normal heart sounds. No murmur heard.    No gallop.  Pulmonary:     Effort: Pulmonary effort is normal. No respiratory distress.     Breath sounds: Normal breath  sounds. No stridor. No wheezing, rhonchi or rales.  Abdominal:     General: Abdomen is flat. Bowel sounds are normal. There is no distension.     Palpations: Abdomen is soft.     Tenderness: There is no abdominal tenderness. There is no guarding.  Musculoskeletal:        General: Normal range of motion.     Cervical back: Normal range of motion and neck supple. No rigidity.     Comments: Tender to palpation over paraspinous muscles of thoracic and lumbar spine, no midline tenderness, no step-off or deformity, no CVA tenderness  Skin:    General: Skin is warm and dry.  Neurological:     General: No focal deficit present.     Mental Status: She is alert  and oriented to person, place, and time. Mental status is at baseline.     Cranial Nerves: No cranial nerve deficit.     Sensory: No sensory deficit.     Motor: No weakness.     Coordination: Coordination normal.     Gait: Gait normal.     Comments: Extension bilateral great toes, extension and flexion at bilateral hips intact, gait stable without assistance  Psychiatric:        Mood and Affect: Mood normal.        Behavior: Behavior normal.        Thought Content: Thought content normal.        Judgment: Judgment normal.     (all labs ordered are listed, but only abnormal results are displayed) Labs Reviewed - No data to display  EKG: None  Radiology: No results found.   Procedures   Medications Ordered in the ED  HYDROcodone -acetaminophen  (NORCO/VICODIN) 5-325 MG per tablet 1 tablet (1 tablet Oral Given 03/15/24 1619)  naproxen  (NAPROSYN ) tablet 500 mg (500 mg Oral Given 03/15/24 1619)                                    Medical Decision Making Amount and/or Complexity of Data Reviewed Radiology: ordered.  Risk Prescription drug management.   This patient presents to the ED for concern of neck pain, back pain differential diagnosis includes muscle strain, muscle spasm, vertebral fracture, cauda equina syndrome, vertebral mellitus, epidural abscess, radiculopathy    Additional history obtained:  Additional history obtained from none External records from outside source obtained and reviewed including none    Imaging Studies ordered:  I ordered imaging studies including CT scan of the cervical spine, MRI cervical spine, x-ray of the thoracic and lumbar spine I independently visualized and interpreted imaging which showed no acute osseous injury or lesions, MRI with edema along the paraspinous muscles consistent with strain, C5 and C6 neuroforaminal narrowing I agree with the radiologist interpretation   Medicines ordered and prescription drug management:  I  ordered medication including hydrocodone , naproxen  for neck and back pain Reevaluation of the patient after these medicines showed that the patient improved I have reviewed the patients home medicines and have made adjustments as needed   Problem List / ED Course:  Patient is doing very well at this time and is stable for discharge home.  Discussed with patient that imaging in the emergency department is consistent with muscle strain at this point.  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  Will continue outpatient treatment with NSAIDs and muscle relaxers.  Recommend close  follow-up with her primary care doctor.  Patient has no concern neurological deficits throughout do not suspect underlying etiology such as cauda equina syndrome, vertebral osteomyelitis, epidural abscess.  Patient has no tenderness throughout her abdomen I do not suspect an acute intra-abdominal surgical process.  She has no urinary complaints at this point.  Strict turn precautions were provided for any new or worsening symptoms.  Patient voiced understanding and had no additional questions.   Social Determinants of Health:  None        Final diagnoses:  None    ED Discharge Orders     None          Tracie Bailey 03/15/24 1818    Tracie Elsie CROME, MD 03/15/24 1921

## 2024-03-15 NOTE — Discharge Instructions (Signed)
 Please follow-up closely with your primary care doctor on an outpatient basis.  Return to the emergency department immediately for any new or worsening symptoms.  [facility] Primary Care Doctor List    Alberteen Huge, MD. Specialty: [facility] Medicine Contact information: [address]  [place] [address]  [phone]   Charlotta Cook, MD. Specialty: [facility] Medicine Contact information: [address]  [place] [address]  [phone]   Clary Crown, MD Specialty: Internal Medicine Contact information: [address] [place] [address]  [phone]   Lucendia Rusk, MD. Specialty: Internal Medicine Contact information: [address]  [place] [address]  [phone]    [facility] (Dr. Burdette Carolin) Specialty: Family Medicine Contact information: [address]  [place] [address]  [phone]   Darrall Ellison, MD. Specialty: [facility] Medicine Contact information: [address]  [address]  [place] [address]  [phone]   Artemisa Bile, MD. Specialty: Internal Medicine Contact information: [address]  [address]  [place] [address]  [phone]   [facility]: [address] [phone]  Selene Dais, Family medicine [address]  [phone]  [facility] [address] [place], [place] [phone]  [facility]: [address] [phone]    [facility]  [address] [place], [place] [address] [phone]  Services [facility] offers a variety of basic health services.  Services include but are not limited to: Blood pressure checks  Heart rate checks  Blood sugar checks  Urine analysis  Rapid strep tests  Pregnancy tests.  Health education and referrals  People needing more complex services will be directed to a physician online. Using these virtual visits, doctors can  evaluate and prescribe medicine and treatments. There will be no medication on-site, though [facility] will help patients fill their prescriptions at little to no cost.   For More information please go to: [email or web address]  Allergy and Asthma:    [address] [facility] [address] Selene Dais [phone]  Urology:  [address]  [place] [phone]  [facility]  [address] [place], [place] [phone]  Orthopedics   [address] [place], [place] [phone]  Endocrinology  [address] [place], [place] [phone]  Podiatry: [facility] [phone]
